# Patient Record
Sex: Male | Born: 1980 | Race: White | Hispanic: No | Marital: Married | Smoking: Never smoker
Health system: Southern US, Community
[De-identification: ages and names within clinical notes are randomized; demographics above are authoritative.]

## PROBLEM LIST (undated history)

## (undated) DIAGNOSIS — J45909 Unspecified asthma, uncomplicated: Secondary | ICD-10-CM

## (undated) DIAGNOSIS — I10 Essential (primary) hypertension: Secondary | ICD-10-CM

---

## 2008-06-28 ENCOUNTER — Emergency Department (HOSPITAL_BASED_OUTPATIENT_CLINIC_OR_DEPARTMENT_OTHER): Admission: EM | Admit: 2008-06-28 | Discharge: 2008-06-28 | Payer: Self-pay | Admitting: Emergency Medicine

## 2011-07-10 LAB — URINALYSIS, ROUTINE W REFLEX MICROSCOPIC
Bilirubin Urine: NEGATIVE
Leukocytes, UA: NEGATIVE
Nitrite: NEGATIVE
Specific Gravity, Urine: 1.013
pH: 6

## 2011-07-10 LAB — URINE MICROSCOPIC-ADD ON

## 2015-12-30 ENCOUNTER — Encounter (HOSPITAL_COMMUNITY): Payer: Self-pay | Admitting: Emergency Medicine

## 2015-12-30 ENCOUNTER — Emergency Department (INDEPENDENT_AMBULATORY_CARE_PROVIDER_SITE_OTHER)
Admission: EM | Admit: 2015-12-30 | Discharge: 2015-12-30 | Disposition: A | Discharge status: Home / Self Care | Payer: 59 | Source: Home / Self Care | Attending: Family Medicine | Admitting: Family Medicine

## 2015-12-30 DIAGNOSIS — J45901 Unspecified asthma with (acute) exacerbation: Secondary | ICD-10-CM | POA: Diagnosis not present

## 2015-12-30 MED ORDER — METHYLPREDNISOLONE ACETATE 80 MG/ML IJ SUSP
80.0000 mg | Freq: Once | INTRAMUSCULAR | Status: AC
  Administered 2015-12-30: 80 mg via INTRAMUSCULAR

## 2015-12-30 MED ORDER — ALBUTEROL SULFATE (2.5 MG/3ML) 0.083% IN NEBU
2.5000 mg | INHALATION_SOLUTION | Freq: Once | RESPIRATORY_TRACT | Status: AC
  Administered 2015-12-30: 2.5 mg via RESPIRATORY_TRACT

## 2015-12-30 MED ORDER — IPRATROPIUM-ALBUTEROL 0.5-2.5 (3) MG/3ML IN SOLN
RESPIRATORY_TRACT | Status: AC
  Filled 2015-12-30: qty 3

## 2015-12-30 MED ORDER — ALBUTEROL SULFATE (2.5 MG/3ML) 0.083% IN NEBU
INHALATION_SOLUTION | RESPIRATORY_TRACT | Status: AC
  Filled 2015-12-30: qty 3

## 2015-12-30 MED ORDER — METHYLPREDNISOLONE ACETATE 80 MG/ML IJ SUSP
INTRAMUSCULAR | Status: AC
  Filled 2015-12-30: qty 1

## 2015-12-30 MED ORDER — IPRATROPIUM-ALBUTEROL 0.5-2.5 (3) MG/3ML IN SOLN
3.0000 mL | Freq: Once | RESPIRATORY_TRACT | Status: AC
  Administered 2015-12-30: 3 mL via RESPIRATORY_TRACT

## 2015-12-30 MED ORDER — ALBUTEROL SULFATE HFA 108 (90 BASE) MCG/ACT IN AERS: 2.0000 | INHALATION_SPRAY | RESPIRATORY_TRACT | Status: AC | PRN

## 2015-12-30 MED ORDER — PREDNISONE 20 MG PO TABS: ORAL_TABLET | ORAL | Status: DC

## 2015-12-30 NOTE — ED Provider Notes (Signed)
CSN: [id number]     Arrival date & time 12/30/15  1539 History   First MD Initiated Contact with Patient 12/30/15 1816     Chief Complaint  Patient presents with  . Shortness of Breath   (Consider location/radiation/quality/duration/timing/severity/associated sxs/prior Treatment) HPI Comments: 35 year old obese male with a history of asthma presents with a cough for one month and dyspnea and wheezing for over a week. He states he has not had an asthma attack in years and does not have any inhalers or other medicines. He denies PND or other allergy symptoms. He is noticed to have sniffles and PND on exam.   History reviewed. No pertinent past medical history. History reviewed. No pertinent past surgical history. No family history on file. Social History  Substance Use Topics  . Smoking status: Never Smoker   . Smokeless tobacco: None  . Alcohol Use: No    Review of Systems  Constitutional: Negative.  Negative for fever.  HENT: Negative.   Respiratory: Positive for cough, shortness of breath and wheezing.   Gastrointestinal: Negative.   Musculoskeletal: Negative.   Skin: Negative.   Neurological: Negative.   All other systems reviewed and are negative.   Allergies  Review of patient's allergies indicates no known allergies.  Home Medications   Prior to Admission medications   Medication Sig Start Date End Date Taking? Authorizing Provider  albuterol (PROVENTIL HFA;VENTOLIN HFA) 108 (90 Base) MCG/ACT inhaler Inhale 2 puffs into the lungs every 4 (four) hours as needed for wheezing or shortness of breath. 12/30/15   Hayden Rasmussenavid Breelynn Bankert, NP  perindopril (ACEON) 2 MG tablet Take 2 mg by mouth daily.    Historical Provider, MD  predniSONE (DELTASONE) 20 MG tablet 3 Tabs PO Days 1-3, then 2 tabs PO Days 4-6, then 1 tab PO Day 7-9, then Half Tab PO Day 10-12 12/30/15   Hayden Rasmussenavid Nomi Rudnicki, NP   Meds Ordered and Administered this Visit   Medications  ipratropium-albuterol (DUONEB) 0.5-2.5 (3) MG/3ML  nebulizer solution 3 mL (3 mLs Nebulization Given 12/30/15 1948)  albuterol (PROVENTIL) (2.5 MG/3ML) 0.083% nebulizer solution 2.5 mg (2.5 mg Nebulization Given 12/30/15 1948)  methylPREDNISolone acetate (DEPO-MEDROL) injection 80 mg (80 mg Intramuscular Given 12/30/15 1948)    BP 140/94 mmHg  Pulse 73  Temp(Src) 98.6 F (37 C) (Oral)  Resp 16  SpO2 99% No data found.   Physical Exam  Constitutional: He is oriented to person, place, and time. He appears well-developed and well-nourished. No distress.  HENT:  Head: Normocephalic and atraumatic.  Oropharynx with minor erythema, cobblestoning and clear PND.  Eyes: Conjunctivae and EOM are normal.  Neck: Normal range of motion. Neck supple.  Cardiovascular: Normal rate, regular rhythm, normal heart sounds and intact distal pulses.   Pulmonary/Chest: Effort normal. No respiratory distress. He has wheezes.  Diminished expiratory breath sounds. A few wheezes with forced expiration. Cough with mild coarseness and distal wheezing.  Musculoskeletal: He exhibits no edema.  Lymphadenopathy:    He has no cervical adenopathy.  Neurological: He is alert and oriented to person, place, and time. He exhibits normal muscle tone.  Skin: Skin is warm and dry. He is not diaphoretic.  Psychiatric: He has a normal mood and affect.  Nursing note and vitals reviewed.   ED Course  Procedures (including critical care time)  Labs Review Labs Reviewed - No data to display  Imaging Review No results found.   Visual Acuity Review  Right Eye Distance:   Left Eye Distance:   Bilateral  Distance:    Right Eye Near:   Left Eye Near:    Bilateral Near:         MDM   1. Asthma exacerbation    Meds ordered this encounter  Medications  . perindopril (ACEON) 2 MG tablet    Sig: Take 2 mg by mouth daily.  Marland Kitchen ipratropium-albuterol (DUONEB) 0.5-2.5 (3) MG/3ML nebulizer solution 3 mL    Sig:   . albuterol (PROVENTIL) (2.5 MG/3ML) 0.083% nebulizer  solution 2.5 mg    Sig:   . methylPREDNISolone acetate (DEPO-MEDROL) injection 80 mg    Sig:   . albuterol (PROVENTIL HFA;VENTOLIN HFA) 108 (90 Base) MCG/ACT inhaler    Sig: Inhale 2 puffs into the lungs every 4 (four) hours as needed for wheezing or shortness of breath.    Dispense:  1 Inhaler    Refill:  0    Order Specific Question:  Supervising Provider    Answer:  Bradd Canary D [id number]  . predniSONE (DELTASONE) 20 MG tablet    Sig: 3 Tabs PO Days 1-3, then 2 tabs PO Days 4-6, then 1 tab PO Day 7-9, then Half Tab PO Day 10-12    Dispense:  20 tablet    Refill:  0    Order Specific Question:  Supervising Provider    Answer:  Linna Hoff [phone]   Post DuoNeb there was much improvement in air movement and decrease and wheezes. Patient is discharged with prescription for albuterol HFA and prednisone. Also asked to change from Claritin to Allegra 180 mg a day follow-up PCP as needed. For worsening or new symptoms may return or go to urgent department.    Hayden Rasmussen, NP 12/30/15 2023

## 2015-12-30 NOTE — ED Notes (Signed)
Pt c/o SOB, wheezing and dry cough onset x1 month that is getting worse Denies fevers, chills.... Hx of asthma A&O x4... No acute distress.

## 2015-12-30 NOTE — Discharge Instructions (Signed)
Asthma, Acute Bronchospasm Acute bronchospasm caused by asthma is also referred to as an asthma attack. Bronchospasm means your air passages become narrowed. The narrowing is caused by inflammation and tightening of the muscles in the air tubes (bronchi) in your lungs. This can make it hard to breathe or cause you to wheeze and cough. CAUSES Possible triggers are: 1. Animal dander from the skin, hair, or feathers of animals. 2. Dust mites contained in house dust. 3. Cockroaches. 4. Pollen from trees or grass. 5. Mold. 6. Cigarette or tobacco smoke. 7. Air pollutants such as dust, household cleaners, hair sprays, aerosol sprays, paint fumes, strong chemicals, or strong odors. 8. Cold air or weather changes. Cold air may trigger inflammation. Winds increase molds and pollens in the air. 9. Strong emotions such as crying or laughing hard. 10. Stress. 11. Certain medicines such as aspirin or beta-blockers. 12. Sulfites in foods and drinks, such as dried fruits and wine. 13. Infections or inflammatory conditions, such as a flu, cold, or inflammation of the nasal membranes (rhinitis). 14. Gastroesophageal reflux disease (GERD). GERD is a condition where stomach acid backs up into your esophagus. 15. Exercise or strenuous activity. SIGNS AND SYMPTOMS   Wheezing.  Excessive coughing, particularly at night.  Chest tightness.  Shortness of breath. DIAGNOSIS  Your health care provider will ask you about your medical history and perform a physical exam. A chest X-ray or blood testing may be performed to look for other causes of your symptoms or other conditions that may have triggered your asthma attack. TREATMENT  Treatment is aimed at reducing inflammation and opening up the airways in your lungs. Most asthma attacks are treated with inhaled medicines. These include quick relief or rescue medicines (such as bronchodilators) and controller medicines (such as inhaled corticosteroids). These  medicines are sometimes given through an inhaler or a nebulizer. Systemic steroid medicine taken by mouth or given through an IV tube also can be used to reduce the inflammation when an attack is moderate or severe. Antibiotic medicines are only used if a bacterial infection is present.  HOME CARE INSTRUCTIONS   Rest.  Drink plenty of liquids. This helps the mucus to remain thin and be easily coughed up. Only use caffeine in moderation and do not use alcohol until you have recovered from your illness.  Do not smoke. Avoid being exposed to secondhand smoke.  You play a critical role in keeping yourself in good health. Avoid exposure to things that cause you to wheeze or to have breathing problems.  Keep your medicines up-to-date and available. Carefully follow your health care provider's treatment plan.  Take your medicine exactly as prescribed.  When pollen or pollution is bad, keep windows closed and use an air conditioner or go to places with air conditioning.  Asthma requires careful medical care. See your health care provider for a follow-up as advised. If you are more than [redacted] weeks pregnant and you were prescribed any new medicines, let your obstetrician know about the visit and how you are doing. Follow up with your health care provider as directed.  After you have recovered from your asthma attack, make an appointment with your outpatient doctor to talk about ways to reduce the likelihood of future attacks. If you do not have a doctor who manages your asthma, make an appointment with a primary care doctor to discuss your asthma. SEEK IMMEDIATE MEDICAL CARE IF:   You are getting worse.  You have trouble breathing. If severe, call your local  emergency services (911 in the [place]  You develop chest pain or discomfort.  You are vomiting.  You are not able to keep fluids down.  You are coughing up yellow, green, brown, or bloody sputum.  You have a fever and your symptoms suddenly  get worse.  You have trouble swallowing. MAKE SURE YOU:   Understand these instructions.  Will watch your condition.  Will get help right away if you are not doing well or get worse.   This information is not intended to replace advice given to you by your health care provider. Make sure you discuss any questions you have with your health care provider.   Document Released: 01/10/2007 Document Revised: 09/30/2013 Document Reviewed: 04/02/2013 Elsevier Interactive Patient Education 2016 [employer].  How to Use an Inhaler Using your inhaler correctly is very important. Good technique will make sure that the medicine reaches your lungs.  HOW TO USE AN INHALER: 16. Take the cap off the inhaler. 17. If this is the first time using your inhaler, you need to prime it. Shake the inhaler for 5 seconds. Release four puffs into the air, away from your face. Ask your doctor for help if you have questions. 18. Shake the inhaler for 5 seconds. 19. Turn the inhaler so the bottle is above the mouthpiece. 20. Put your pointer finger on top of the bottle. Your thumb holds the bottom of the inhaler. 21. Open your mouth. 22. Either hold the inhaler away from your mouth (the width of 2 fingers) or place your lips tightly around the mouthpiece. Ask your doctor which way to use your inhaler. 23. Breathe out as much air as possible. 24. Breathe in and push down on the bottle 1 time to release the medicine. You will feel the medicine go in your mouth and throat. 25. Continue to take a deep breath in very slowly. Try to fill your lungs. 26. After you have breathed in completely, hold your breath for 10 seconds. This will help the medicine to settle in your lungs. If you cannot hold your breath for 10 seconds, hold it for as long as you can before you breathe out. 27. Breathe out slowly, through pursed lips. Whistling is an example of pursed lips. 28. If your doctor has told you to take more than 1 puff, wait  at least 15-30 seconds between puffs. This will help you get the best results from your medicine. Do not use the inhaler more than your doctor tells you to. 29. Put the cap back on the inhaler. 30. Follow the directions from your doctor or from the inhaler package about cleaning the inhaler. If you use more than one inhaler, ask your doctor which inhalers to use and what order to use them in. Ask your doctor to help you figure out when you will need to refill your inhaler.  If you use a steroid inhaler, always rinse your mouth with water after your last puff, gargle and spit out the water. Do not swallow the water. GET HELP IF:  The inhaler medicine only partially helps to stop wheezing or shortness of breath.  You are having trouble using your inhaler.  You have some increase in thick spit (phlegm). GET HELP RIGHT AWAY IF:  The inhaler medicine does not help your wheezing or shortness of breath or you have tightness in your chest.  You have dizziness, headaches, or fast heart rate.  You have chills, fever, or night sweats.  You have a large  increase of thick spit, or your thick spit is bloody. MAKE SURE YOU:   Understand these instructions.  Will watch your condition.  Will get help right away if you are not doing well or get worse.   This information is not intended to replace advice given to you by your health care provider. Make sure you discuss any questions you have with your health care provider.   Document Released: 07/04/2008 Document Revised: 07/16/2013 Document Reviewed: 04/24/2013 Elsevier Interactive Patient Education [employer]2016 Elsevier Inc.

## 2015-12-31 MED FILL — VENTOLIN HFA 90 MCG INHALER: 108 (90 BAS | 17 days supply | Qty: 18 | Fill #0

## 2015-12-31 MED FILL — predniSONE 20 MG TABS: 20 | 12 days supply | Qty: 20 | Fill #0

## 2016-01-02 ENCOUNTER — Emergency Department (HOSPITAL_BASED_OUTPATIENT_CLINIC_OR_DEPARTMENT_OTHER): Payer: 59

## 2016-01-02 ENCOUNTER — Emergency Department (HOSPITAL_BASED_OUTPATIENT_CLINIC_OR_DEPARTMENT_OTHER)
Admission: EM | Admit: 2016-01-02 | Discharge: 2016-01-03 | Disposition: A | Discharge status: Home / Self Care | Payer: 59 | Attending: Emergency Medicine | Admitting: Emergency Medicine

## 2016-01-02 ENCOUNTER — Encounter (HOSPITAL_BASED_OUTPATIENT_CLINIC_OR_DEPARTMENT_OTHER): Payer: Self-pay | Admitting: Emergency Medicine

## 2016-01-02 DIAGNOSIS — J45901 Unspecified asthma with (acute) exacerbation: Secondary | ICD-10-CM | POA: Insufficient documentation

## 2016-01-02 DIAGNOSIS — Z79899 Other long term (current) drug therapy: Secondary | ICD-10-CM | POA: Insufficient documentation

## 2016-01-02 DIAGNOSIS — R0602 Shortness of breath: Secondary | ICD-10-CM | POA: Diagnosis not present

## 2016-01-02 HISTORY — DX: Unspecified asthma, uncomplicated: J45.909

## 2016-01-02 NOTE — ED Notes (Signed)
Patient was at Urgent care 3 days ago for the same SOb, reports that today he felt like his SOB was getting worse.

## 2016-01-03 DIAGNOSIS — J45901 Unspecified asthma with (acute) exacerbation: Secondary | ICD-10-CM | POA: Diagnosis not present

## 2016-01-03 DIAGNOSIS — Z79899 Other long term (current) drug therapy: Secondary | ICD-10-CM | POA: Diagnosis not present

## 2016-01-03 MED ORDER — ALBUTEROL SULFATE (2.5 MG/3ML) 0.083% IN NEBU
2.5000 mg | INHALATION_SOLUTION | Freq: Once | RESPIRATORY_TRACT | Status: AC
  Administered 2016-01-03: 2.5 mg via RESPIRATORY_TRACT
  Filled 2016-01-03: qty 3

## 2016-01-03 MED ORDER — IPRATROPIUM-ALBUTEROL 0.5-2.5 (3) MG/3ML IN SOLN
3.0000 mL | Freq: Four times a day (QID) | RESPIRATORY_TRACT | Status: DC
  Administered 2016-01-03: 3 mL via RESPIRATORY_TRACT
  Filled 2016-01-03: qty 3

## 2016-01-03 NOTE — ED Provider Notes (Signed)
CSN: [id number]     Arrival date & time 01/02/16  2251 History   First MD Initiated Contact with Patient 01/03/16 [phone]     Chief Complaint  Patient presents with  . Shortness of Breath     (Consider location/radiation/quality/duration/timing/severity/associated sxs/prior Treatment) HPI  This is a 35 year old male with a history of asthma. He has had a one-month history of cough and a one-week history of shortness of breath. He was seen at an urgent care 4 days ago and was given an inhaler and steroids. Despite being compliant with treatment he has not gotten adequate relief and he has been using his inhaler more frequently than every 4 hours. He denies fever. He has had wheezing. He is having some upper abdominal wall pain which she attributes to coughing.  He was assessed by respiratory therapy prior to my evaluation who found him to be wheezing. He was given 2 neb treatments with improvement. He was also given an AeroChamber and instructed in its use.  Past Medical History  Diagnosis Date  . Asthma    History reviewed. No pertinent past surgical history. History reviewed. No pertinent family history. Social History  Substance Use Topics  . Smoking status: Never Smoker   . Smokeless tobacco: None  . Alcohol Use: No    Review of Systems  All other systems reviewed and are negative.   Allergies  Review of patient's allergies indicates no known allergies.  Home Medications   Prior to Admission medications   Medication Sig Start Date End Date Taking? Authorizing Provider  albuterol (PROVENTIL HFA;VENTOLIN HFA) 108 (90 Base) MCG/ACT inhaler Inhale 2 puffs into the lungs every 4 (four) hours as needed for wheezing or shortness of breath. 12/30/15   Hayden Rasmussen, NP  perindopril (ACEON) 2 MG tablet Take 2 mg by mouth daily.    Historical Provider, MD  predniSONE (DELTASONE) 20 MG tablet 3 Tabs PO Days 1-3, then 2 tabs PO Days 4-6, then 1 tab PO Day 7-9, then Half Tab PO Day 10-12  12/30/15   Hayden Rasmussen, NP   BP 127/63 mmHg  Pulse 73  Temp(Src) 98.6 F (37 C) (Oral)  Resp 14  Ht  (1.727 m)  Wt 360 lb (163.295 kg)  BMI 54.75 kg/m2  SpO2 100%   Physical Exam  General: Well-developed, well-nourished male in no acute distress; appearance consistent with age of record HENT: normocephalic; atraumatic; pharynx normal Eyes: pupils equal, round and reactive to light; extraocular muscles intact Neck: supple Heart: regular rate and rhythm Lungs: Faint coarse sounds on inspiration, no expiratory wheezes Abdomen: soft; obese; nontender; bowel sounds present Extremities: No deformity; full range of motion; pulses normal Neurologic: Awake, alert and oriented; motor function intact in all extremities and symmetric; no facial droop Skin: Warm and dry Psychiatric: Normal mood and affect    ED Course  Procedures (including critical care time)   EKG Interpretation   Date/Time:  Sunday January 02 2016 23:07:59 EDT Ventricular Rate:  87 PR Interval:  130 QRS Duration: 105 QT Interval:  385 QTC Calculation: 463 R Axis:   87 Text Interpretation:  Sinus rhythm Probable inferior infarct, old No  previous ECGs available Confirmed by Barbaraann Avans  MD, Jonny Ruiz ([id number]) on 01/03/2016  1:57:48 AM      MDM  Nursing notes and vitals signs, including pulse oximetry, reviewed.  Summary of this visit's results, reviewed by myself:  Imaging Studies: Dg Chest 2 View  01/03/2016  CLINICAL DATA:  Shortness of breath for  3 days.  History of asthma. EXAM: CHEST  2 VIEW COMPARISON:  None. FINDINGS: Cardiomediastinal silhouette is unremarkable for this low inspiratory examination with crowded vasculature markings. The lungs are clear without pleural effusions or focal consolidations. Mild bronchitic changes. Trachea projects midline and there is no pneumothorax. Included soft tissue planes and osseous structures are non-suspicious. Large body habitus. IMPRESSION: Mild bronchitic changes in  this low inspiratory examination. Electronically Signed   By: Awilda Metroourtnay  Bloomer M.D.   On: 01/03/2016 00:29       Paula LibraJohn Lenzie Montesano, MD 01/03/16 [phone]

## 2016-01-03 NOTE — Discharge Instructions (Signed)
Asthma Attack Prevention While you may not be able to control the fact that you have asthma, you can take actions to prevent asthma attacks. The best way to prevent asthma attacks is to maintain good control of your asthma. You can achieve this by:  Taking your medicines as directed.  Avoiding things that can irritate your airways or make your asthma symptoms worse (asthma triggers).  Keeping track of how well your asthma is controlled and of any changes in your symptoms.  Responding quickly to worsening asthma symptoms (asthma attack).  Seeking emergency care when it is needed. WHAT ARE SOME WAYS TO PREVENT AN ASTHMA ATTACK? Have a Plan Work with your health care provider to create a written plan for managing and treating your asthma attacks (asthma action plan). This plan includes:  A list of your asthma triggers and how you can avoid them.  Information on when medicines should be taken and when their dosages should be changed.  The use of a device that measures how well your lungs are working (peak flow meter). Monitor Your Asthma Use your peak flow meter and record your results in a journal every day. A drop in your peak flow numbers on one or more days may indicate the start of an asthma attack. This can happen even before you start to feel symptoms. You can prevent an asthma attack from getting worse by following the steps in your asthma action plan. Avoid Asthma Triggers Work with your asthma health care provider to find out what your asthma triggers are. This can be done by:  Allergy testing.  Keeping a journal that notes when asthma attacks occur and the factors that may have contributed to them.  Determining if there are other medical conditions that are making your asthma worse. Once you have determined your asthma triggers, take steps to avoid them. This may include avoiding excessive or prolonged exposure to:  Dust. Have someone dust and vacuum your home for you once or  twice a week. Using a high-efficiency particulate arrestance (HEPA) vacuum is best.  Smoke. This includes campfire smoke, forest fire smoke, and secondhand smoke from tobacco products.  Pet dander. Avoid contact with animals that you know you are allergic to.  Allergens from trees, grasses or pollens. Avoid spending a lot of time outdoors when pollen counts are high, and on very windy days.  Very cold, dry, or humid air.  Mold.  Foods that contain high amounts of sulfites.  Strong odors.  Outdoor air pollutants, such as engine exhaust.  Indoor air pollutants, such as aerosol sprays and fumes from household cleaners.  Household pests, including dust mites and cockroaches, and pest droppings.  Certain medicines, including NSAIDs. Always talk to your health care provider before stopping or starting any new medicines. Medicines Take over-the-counter and prescription medicines only as told by your health care provider. Many asthma attacks can be prevented by carefully following your medicine schedule. Taking your medicines correctly is especially important when you cannot avoid certain asthma triggers. Act Quickly If an asthma attack does happen, acting quickly can decrease how severe it is and how long it lasts. Take these steps:   Pay attention to your symptoms. If you are coughing, wheezing, or having difficulty breathing, do not wait to see if your symptoms go away on their own. Follow your asthma action plan.  If you have followed your asthma action plan and your symptoms are not improving, call your health care provider or seek immediate medical care   at the nearest hospital. It is important to note how often you need to use your fast-acting rescue inhaler. If you are using your rescue inhaler more often, it may mean that your asthma is not under control. Adjusting your asthma treatment plan may help you to prevent future asthma attacks and help you to gain better control of your  condition. HOW CAN I PREVENT AN ASTHMA ATTACK WHEN I EXERCISE? Follow advice from your health care provider about whether you should use your fast-acting inhaler before exercising. Many people with asthma experience exercise-induced bronchoconstriction (EIB). This condition often worsens during vigorous exercise in cold, humid, or dry environments. Usually, people with EIB can stay very active by pre-treating with a fast-acting inhaler before exercising.   This information is not intended to replace advice given to you by your health care provider. Make sure you discuss any questions you have with your health care provider.   Document Released: 09/13/2009 Document Revised: 06/16/2015 Document Reviewed: 02/25/2015 Elsevier Interactive Patient Education 2016 Elsevier Inc.  

## 2016-01-05 DIAGNOSIS — J301 Allergic rhinitis due to pollen: Secondary | ICD-10-CM | POA: Diagnosis not present

## 2016-01-05 DIAGNOSIS — J9801 Acute bronchospasm: Secondary | ICD-10-CM | POA: Diagnosis not present

## 2016-01-05 MED FILL — AZITHROMYCIN 250 MG TABLET: 250 | 5 days supply | Qty: 6 | Fill #0

## 2016-01-05 MED FILL — MONTELUKAST SOD 10 MG TAB: 10 | 30 days supply | Qty: 30 | Fill #0

## 2016-01-05 MED FILL — ADVAIR 250/50 DISKUS: 250-50 | 30 days supply | Qty: 60 | Fill #0

## 2016-01-19 DIAGNOSIS — I10 Essential (primary) hypertension: Secondary | ICD-10-CM | POA: Diagnosis not present

## 2016-01-19 DIAGNOSIS — J4520 Mild intermittent asthma, uncomplicated: Secondary | ICD-10-CM | POA: Diagnosis not present

## 2016-02-18 MED FILL — ADVAIR 250/50 DISKUS: 250-50 | 30 days supply | Qty: 60 | Fill #1

## 2016-02-18 MED FILL — METOPROLOL SUCC ER 25 MG TA: 25 | 30 days supply | Qty: 30 | Fill #0

## 2016-02-18 MED FILL — PERINDOPRIL ERBUMINE 8 MG T: 8 | 30 days supply | Qty: 30 | Fill #0

## 2016-02-18 MED FILL — MONTELUKAST SOD 10 MG TAB: 10 | 30 days supply | Qty: 30 | Fill #1

## 2016-03-03 DIAGNOSIS — R739 Hyperglycemia, unspecified: Secondary | ICD-10-CM | POA: Diagnosis not present

## 2016-03-03 DIAGNOSIS — I10 Essential (primary) hypertension: Secondary | ICD-10-CM | POA: Diagnosis not present

## 2016-03-03 DIAGNOSIS — M25522 Pain in left elbow: Secondary | ICD-10-CM | POA: Diagnosis not present

## 2016-03-16 MED FILL — DICLOFENAC SODIUM 1% GEL: 1 | 13 days supply | Qty: 100 | Fill #0

## 2016-03-23 MED FILL — METOPROLOL SUCC ER 25 MG TA: 25 | 30 days supply | Qty: 30 | Fill #1

## 2016-04-07 MED FILL — PERINDOPRIL ERBUMINE 8 MG T: 8 | 30 days supply | Qty: 30 | Fill #0

## 2016-04-20 MED FILL — METOPROLOL SUCC ER 25 MG TA: 25 | 90 days supply | Qty: 90 | Fill #0

## 2016-05-05 MED FILL — PERINDOPRIL ERBUMINE 8 MG T: 8 | 30 days supply | Qty: 30 | Fill #1

## 2016-06-08 MED FILL — PERINDOPRIL ERBUMINE 8 MG T: 8 | 30 days supply | Qty: 30 | Fill #2

## 2016-07-13 MED FILL — PERINDOPRIL ERBUMINE 8 MG T: 8 | 30 days supply | Qty: 30 | Fill #3

## 2016-07-25 DIAGNOSIS — I10 Essential (primary) hypertension: Secondary | ICD-10-CM | POA: Diagnosis not present

## 2016-07-25 DIAGNOSIS — L209 Atopic dermatitis, unspecified: Secondary | ICD-10-CM | POA: Diagnosis not present

## 2016-07-25 MED FILL — METOPROLOL TARTRATE 50 MG T: 50 | 30 days supply | Qty: 60 | Fill #0

## 2016-08-11 MED FILL — PERINDOPRIL ERBUMINE 8 MG T: 8 | 30 days supply | Qty: 30 | Fill #4

## 2016-08-17 DIAGNOSIS — I10 Essential (primary) hypertension: Secondary | ICD-10-CM | POA: Diagnosis not present

## 2016-08-29 MED FILL — METOPROLOL TARTRATE 50 MG T: 50 | 30 days supply | Qty: 60 | Fill #1

## 2016-09-01 MED FILL — PERINDOPRIL ERBUMINE 8 MG T: 8 | 30 days supply | Qty: 30 | Fill #5

## 2016-09-29 MED FILL — METOPROLOL TARTRATE 50 MG T: 50 | 30 days supply | Qty: 60 | Fill #2

## 2016-10-12 MED FILL — PERINDOPRIL ERBUMINE 8 MG T: 8 | 30 days supply | Qty: 30 | Fill #6

## 2016-10-30 DIAGNOSIS — I10 Essential (primary) hypertension: Secondary | ICD-10-CM | POA: Diagnosis not present

## 2016-10-30 MED FILL — METOPROLOL TARTRATE 50 MG T: 50 | 30 days supply | Qty: 90 | Fill #0

## 2016-11-09 MED FILL — PERINDOPRIL ERBUMINE 8 MG T: 8 | 30 days supply | Qty: 30 | Fill #7

## 2016-12-13 IMAGING — DX DG CHEST 2V
2 series · 2 of 2 positions shown · non-contrast
Comparison: None.

CLINICAL DATA: Shortness of breath for 3 days.  History of asthma.

EXAM:
CHEST  2 VIEW

[chest pa]
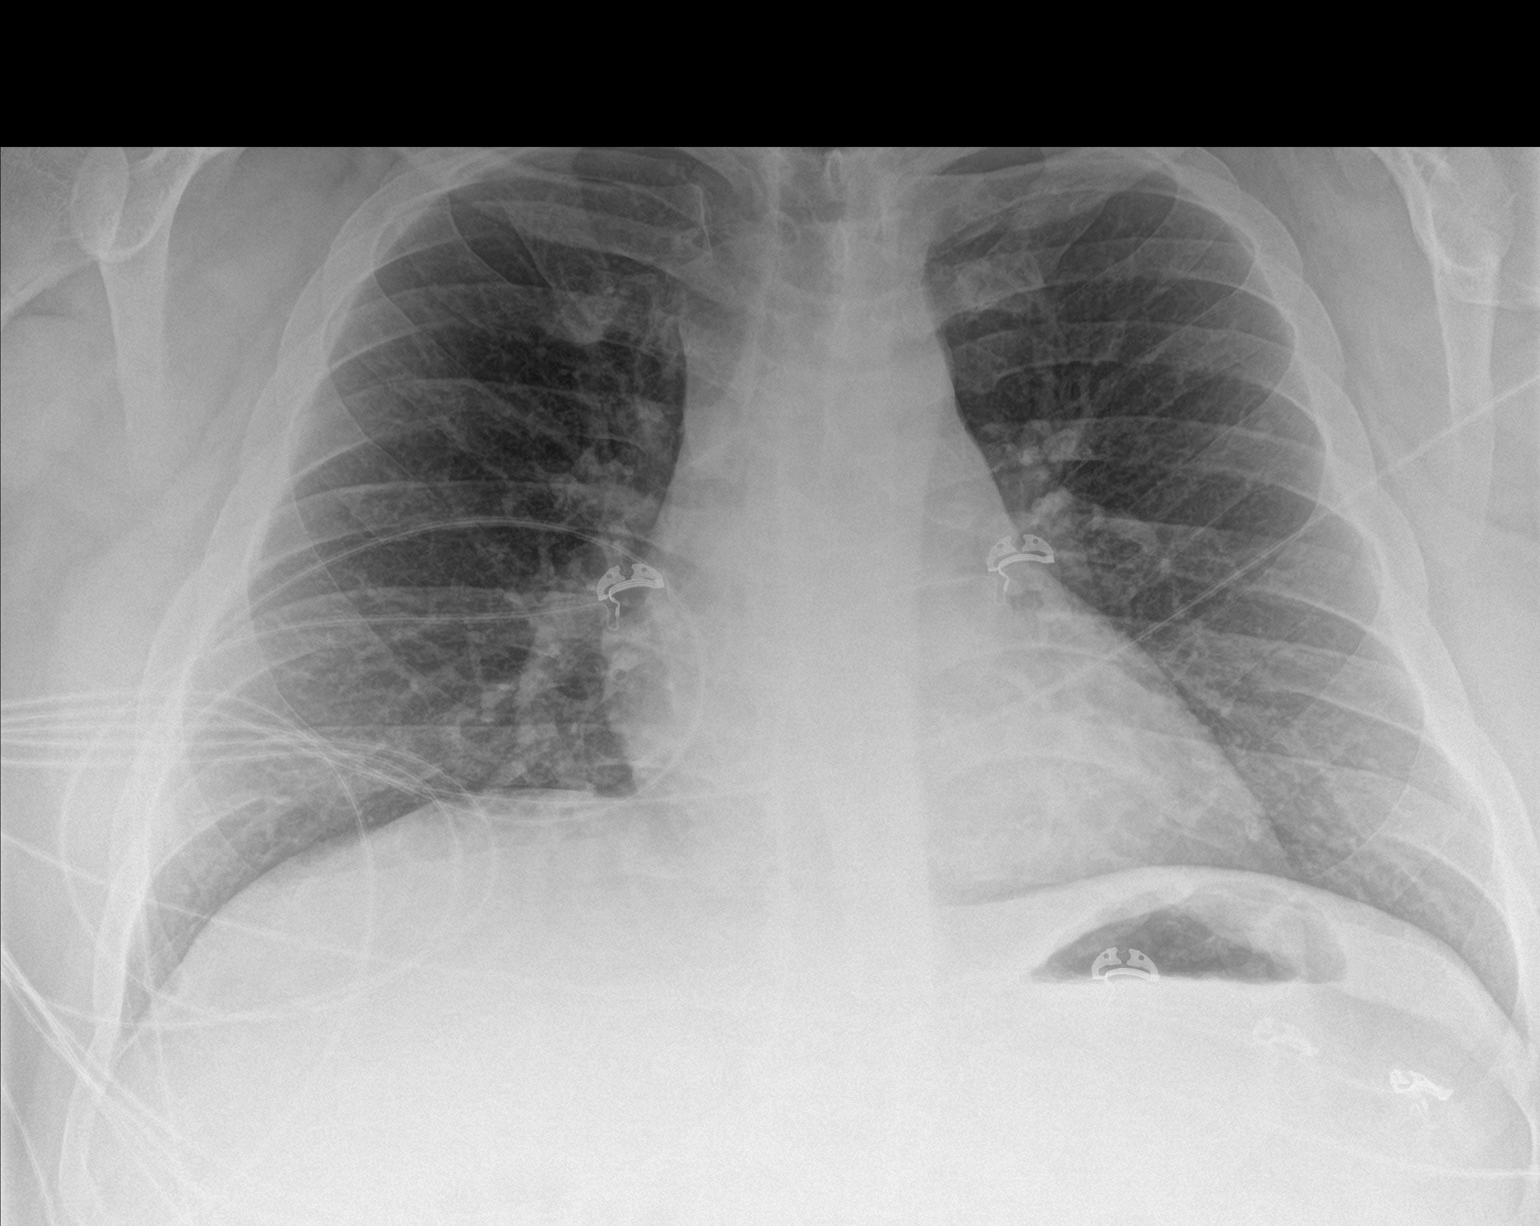

[chest lat]
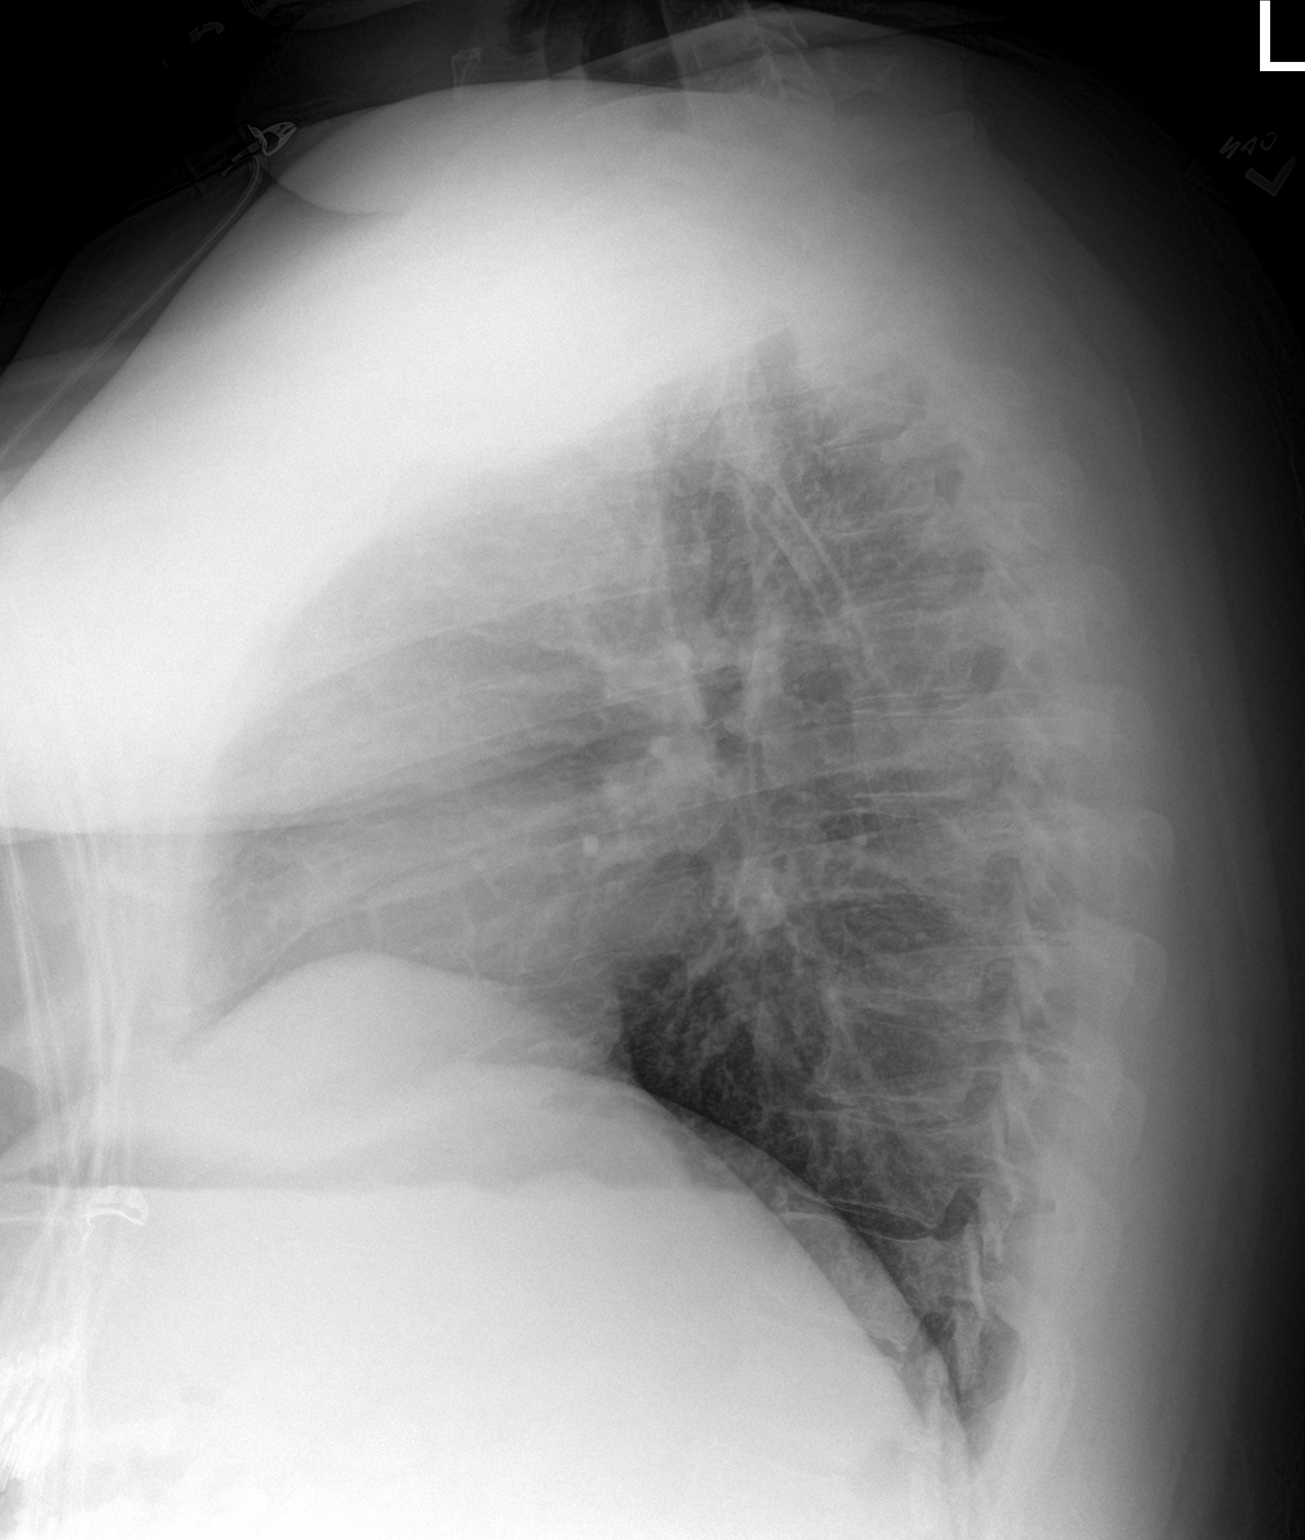

[2 of 2 positions shown; findings below may reference images not displayed]

FINDINGS: Cardiomediastinal silhouette is unremarkable for this low
inspiratory examination with crowded vasculature markings. The lungs
are clear without pleural effusions or focal consolidations. Mild
bronchitic changes. Trachea projects midline and there is no
pneumothorax. Included soft tissue planes and osseous structures are
non-suspicious. Large body habitus.
IMPRESSION: Mild bronchitic changes in this low inspiratory examination.

## 2016-12-14 MED FILL — PERINDOPRIL ERBUMINE 8 MG T: 8 | 30 days supply | Qty: 30 | Fill #8

## 2016-12-14 MED FILL — METOPROLOL TARTRATE 50 MG T: 50 | 30 days supply | Qty: 90 | Fill #1

## 2017-01-17 MED FILL — PERINDOPRIL ERBUMINE 8 MG T: 8 | 30 days supply | Qty: 30 | Fill #9

## 2017-02-02 MED FILL — METOPROLOL TARTRATE 50 MG T: 50 | 30 days supply | Qty: 90 | Fill #2

## 2017-03-09 MED FILL — PERINDOPRIL ERBUMINE 8 MG T: 8 | 30 days supply | Qty: 30 | Fill #10

## 2017-03-19 MED FILL — METOPROLOL TARTRATE 50 MG T: 50 | 30 days supply | Qty: 90 | Fill #3

## 2017-04-09 MED FILL — PERINDOPRIL ERBUMINE 8 MG T: 8 | 30 days supply | Qty: 30 | Fill #0

## 2017-05-09 MED FILL — PERINDOPRIL ERBUMINE 8 MG T: 8 | 30 days supply | Qty: 30 | Fill #1

## 2017-05-09 MED FILL — METOPROLOL TARTRATE 50 MG T: 50 | 30 days supply | Qty: 90 | Fill #4

## 2017-06-01 DIAGNOSIS — L03311 Cellulitis of abdominal wall: Secondary | ICD-10-CM | POA: Diagnosis not present

## 2017-06-01 DIAGNOSIS — L309 Dermatitis, unspecified: Secondary | ICD-10-CM | POA: Diagnosis not present

## 2017-06-01 MED FILL — CEPHALEXIN 500 MG CAPSULE: 500 | 7 days supply | Qty: 21 | Fill #0

## 2017-06-02 ENCOUNTER — Encounter (HOSPITAL_BASED_OUTPATIENT_CLINIC_OR_DEPARTMENT_OTHER): Payer: Self-pay | Admitting: Emergency Medicine

## 2017-06-02 DIAGNOSIS — Z79899 Other long term (current) drug therapy: Secondary | ICD-10-CM | POA: Diagnosis not present

## 2017-06-02 DIAGNOSIS — J45909 Unspecified asthma, uncomplicated: Secondary | ICD-10-CM | POA: Diagnosis present

## 2017-06-02 DIAGNOSIS — Z7952 Long term (current) use of systemic steroids: Secondary | ICD-10-CM | POA: Diagnosis not present

## 2017-06-02 DIAGNOSIS — Z6841 Body Mass Index (BMI) 40.0 and over, adult: Secondary | ICD-10-CM | POA: Diagnosis not present

## 2017-06-02 DIAGNOSIS — I10 Essential (primary) hypertension: Secondary | ICD-10-CM | POA: Diagnosis present

## 2017-06-02 DIAGNOSIS — L03311 Cellulitis of abdominal wall: Principal | ICD-10-CM | POA: Diagnosis present

## 2017-06-02 DIAGNOSIS — E876 Hypokalemia: Secondary | ICD-10-CM | POA: Diagnosis present

## 2017-06-02 NOTE — ED Triage Notes (Signed)
Patient states that he was bit by some bee's about 2 weeks ago. He started to have other welts show up. Yesterday he went to his PMD because of one on his leftlower abdominal area. Was given a shot of rocephin and started on Keflex. Patient reports that the wound is not any better

## 2017-06-03 ENCOUNTER — Inpatient Hospital Stay (HOSPITAL_BASED_OUTPATIENT_CLINIC_OR_DEPARTMENT_OTHER)
Admission: EM | Admit: 2017-06-03 | Discharge: 2017-06-06 | DRG: 603 | Disposition: A | Discharge status: Home / Self Care | Payer: 59 | Attending: Internal Medicine | Admitting: Internal Medicine

## 2017-06-03 DIAGNOSIS — L03311 Cellulitis of abdominal wall: Secondary | ICD-10-CM | POA: Diagnosis not present

## 2017-06-03 DIAGNOSIS — L0390 Cellulitis, unspecified: Secondary | ICD-10-CM | POA: Diagnosis not present

## 2017-06-03 DIAGNOSIS — E876 Hypokalemia: Secondary | ICD-10-CM | POA: Diagnosis present

## 2017-06-03 DIAGNOSIS — J45909 Unspecified asthma, uncomplicated: Secondary | ICD-10-CM | POA: Diagnosis present

## 2017-06-03 DIAGNOSIS — E875 Hyperkalemia: Secondary | ICD-10-CM | POA: Diagnosis not present

## 2017-06-03 DIAGNOSIS — Z6841 Body Mass Index (BMI) 40.0 and over, adult: Secondary | ICD-10-CM | POA: Diagnosis not present

## 2017-06-03 DIAGNOSIS — Z7952 Long term (current) use of systemic steroids: Secondary | ICD-10-CM | POA: Diagnosis not present

## 2017-06-03 DIAGNOSIS — I10 Essential (primary) hypertension: Secondary | ICD-10-CM | POA: Diagnosis not present

## 2017-06-03 DIAGNOSIS — R109 Unspecified abdominal pain: Secondary | ICD-10-CM | POA: Diagnosis not present

## 2017-06-03 DIAGNOSIS — Z79899 Other long term (current) drug therapy: Secondary | ICD-10-CM | POA: Diagnosis not present

## 2017-06-03 HISTORY — DX: Essential (primary) hypertension: I10

## 2017-06-03 LAB — CBC WITH DIFFERENTIAL/PLATELET
BASOS ABS: 0 10*3/uL (ref 0.0–0.1)
BASOS PCT: 0 %
EOS ABS: 0.4 10*3/uL (ref 0.0–0.7)
EOS PCT: 3 %
HCT: 43.7 % (ref 39.0–52.0)
Hemoglobin: 14.8 g/dL (ref 13.0–17.0)
Lymphocytes Relative: 29 %
Lymphs Abs: 3.3 10*3/uL (ref 0.7–4.0)
MCH: 28.8 pg (ref 26.0–34.0)
MCHC: 33.9 g/dL (ref 30.0–36.0)
MCV: 85.2 fL (ref 78.0–100.0)
Monocytes Absolute: 1.2 10*3/uL — ABNORMAL HIGH (ref 0.1–1.0)
Monocytes Relative: 10 %
Neutro Abs: 6.8 10*3/uL (ref 1.7–7.7)
Neutrophils Relative %: 58 %
PLATELETS: 222 10*3/uL (ref 150–400)
RBC: 5.13 MIL/uL (ref 4.22–5.81)
RDW: 13.1 % (ref 11.5–15.5)
WBC: 11.6 10*3/uL — AB (ref 4.0–10.5)

## 2017-06-03 LAB — COMPREHENSIVE METABOLIC PANEL
ALBUMIN: 3.9 g/dL (ref 3.5–5.0)
ALT: 24 U/L (ref 17–63)
AST: 20 U/L (ref 15–41)
Alkaline Phosphatase: 70 U/L (ref 38–126)
Anion gap: 7 (ref 5–15)
BUN: 18 mg/dL (ref 6–20)
CHLORIDE: 100 mmol/L — AB (ref 101–111)
CO2: 28 mmol/L (ref 22–32)
CREATININE: 0.83 mg/dL (ref 0.61–1.24)
Calcium: 8.6 mg/dL — ABNORMAL LOW (ref 8.9–10.3)
GFR calc non Af Amer: >60 mL/min (ref >60)
GLUCOSE: 91 mg/dL (ref 65–99)
Potassium: 3.2 mmol/L — ABNORMAL LOW (ref 3.5–5.1)
SODIUM: 135 mmol/L (ref 135–145)
Total Bilirubin: 0.7 mg/dL (ref 0.3–1.2)
Total Protein: 8.2 g/dL — ABNORMAL HIGH (ref 6.5–8.1)

## 2017-06-03 LAB — I-STAT CG4 LACTIC ACID, ED: Lactic Acid, Venous: 0.62 mmol/L (ref 0.5–1.9)

## 2017-06-03 MED ORDER — ALBUTEROL SULFATE (2.5 MG/3ML) 0.083% IN NEBU: 2.5000 mg | INHALATION_SOLUTION | RESPIRATORY_TRACT | Status: DC | PRN

## 2017-06-03 MED ORDER — CLINDAMYCIN PHOSPHATE 600 MG/50ML IV SOLN
600.0000 mg | Freq: Three times a day (TID) | INTRAVENOUS | Status: DC
  Administered 2017-06-03 – 2017-06-06 (×9): 600 mg via INTRAVENOUS
  Filled 2017-06-03 (×13): qty 50

## 2017-06-03 MED ORDER — RISAQUAD PO CAPS
2.0000 | ORAL_CAPSULE | Freq: Every day | ORAL | Status: DC
  Administered 2017-06-03 – 2017-06-06 (×4): 2 via ORAL
  Filled 2017-06-03 (×4): qty 2

## 2017-06-03 MED ORDER — CLINDAMYCIN PHOSPHATE 600 MG/50ML IV SOLN
600.0000 mg | Freq: Once | INTRAVENOUS | Status: AC
  Administered 2017-06-03: 600 mg via INTRAVENOUS
  Filled 2017-06-03: qty 50

## 2017-06-03 MED ORDER — SODIUM CHLORIDE 0.9 % IV BOLUS (SEPSIS)
1000.0000 mL | Freq: Once | INTRAVENOUS | Status: AC
  Administered 2017-06-03: 1000 mL via INTRAVENOUS

## 2017-06-03 MED ORDER — METOPROLOL TARTRATE 50 MG PO TABS
100.0000 mg | ORAL_TABLET | Freq: Every day | ORAL | Status: DC
  Administered 2017-06-03 – 2017-06-06 (×4): 100 mg via ORAL
  Filled 2017-06-03 (×4): qty 2

## 2017-06-03 MED ORDER — POTASSIUM CHLORIDE 20 MEQ/15ML (10%) PO SOLN
40.0000 meq | Freq: Once | ORAL | Status: AC
  Administered 2017-06-03: 40 meq via ORAL
  Filled 2017-06-03: qty 30

## 2017-06-03 MED ORDER — TRANDOLAPRIL 1 MG PO TABS
1.0000 mg | ORAL_TABLET | Freq: Every day | ORAL | Status: DC
  Administered 2017-06-04 – 2017-06-06 (×3): 1 mg via ORAL
  Filled 2017-06-03 (×4): qty 1

## 2017-06-03 MED ORDER — SODIUM CHLORIDE 0.9 % IV SOLN
INTRAVENOUS | Status: DC
  Administered 2017-06-03 – 2017-06-04 (×4): via INTRAVENOUS

## 2017-06-03 MED ORDER — ONDANSETRON HCL 4 MG/2ML IJ SOLN: 4.0000 mg | Freq: Three times a day (TID) | INTRAMUSCULAR | Status: DC | PRN

## 2017-06-03 MED ORDER — METOPROLOL TARTRATE 50 MG PO TABS
50.0000 mg | ORAL_TABLET | Freq: Every day | ORAL | Status: DC
  Administered 2017-06-03 – 2017-06-05 (×3): 50 mg via ORAL
  Filled 2017-06-03 (×3): qty 1

## 2017-06-03 MED ORDER — METOPROLOL TARTRATE 50 MG PO TABS: 50.0000 mg | ORAL_TABLET | Freq: Two times a day (BID) | ORAL | Status: DC

## 2017-06-03 MED ORDER — OXYCODONE-ACETAMINOPHEN 5-325 MG PO TABS: 1.0000 | ORAL_TABLET | ORAL | Status: DC | PRN

## 2017-06-03 NOTE — H&P (Signed)
History and Physical    James Dominguez ZOX:[id number] DOB: September 03, 1981 DOA: 06/03/2017  PCP: Jamal Collin, PA-C  Patient coming from home  I have personally briefly reviewed patient's old medical records in [facility] Link  Chief Complaint: bee bite  HPI: James Dominguez is a 36 y.o. male admitted with bee bites.he was mowing the lawn when he ran intothe bee nest and had multiple bites to his body two weeks ago.the bite on the abdominal wall becamemore and more red.he was started on keflex by pcp.did not make any improvement.no fever chills nausea vomiting diarrhea.he was also on steroids taper for the bites.  ED Course: received k replacement.clindamycin and ivf.  Review of Systems: As per HPI otherwise 10 point review of systems negative.    Past Medical History:  Diagnosis Date  . Asthma   . Hypertension     History reviewed. No pertinent surgical history.   reports that he has never smoked. He has never used smokeless tobacco. He reports that he does not drink alcohol or use drugs.  No Known Allergies  History reviewed. No pertinent family history. Unacceptable: Noncontributory, unremarkable, or negative. Acceptable: Family history reviewed and not pertinent (If you reviewed it)  Prior to Admission medications   Medication Sig Start Date End Date Taking? Authorizing Provider  albuterol (PROVENTIL HFA;VENTOLIN HFA) 108 (90 Base) MCG/ACT inhaler Inhale 2 puffs into the lungs every 4 (four) hours as needed for wheezing or shortness of breath. 12/30/15  Yes Mabe, Onalee Hua, NP  metoprolol tartrate (LOPRESSOR) 50 MG tablet Take 50-100 mg by mouth 2 (two) times daily. Take 100 mg by mouth every morning and 50 mg evening the evening 05/09/17  Yes [provider]  perindopril (ACEON) 8 MG tablet Take 8 mg by mouth daily. 05/09/17  Yes [provider]  predniSONE (DELTASONE) 20 MG tablet 3 Tabs PO Days 1-3, then 2 tabs PO Days 4-6, then 1 tab PO Day 7-9, then Half Tab PO  Day 10-12 Patient not taking: Reported on 06/03/2017 12/30/15   Hayden Rasmussen, NP    Physical Exam: Vitals:   06/03/17 0221 06/03/17 0531 06/03/17 0532 06/03/17 0654  BP: 128/82 (!) 166/108 (!) 166/108 (!) 137/92  Pulse: 72 89 85 76  Resp: 18 20  16   Temp:    98.2 F (36.8 C)  TempSrc:    Oral  SpO2: 96% 98% 99% 99%  Weight:      Height:        Constitutional: NAD, calm, comfortable Vitals:   06/03/17 0221 06/03/17 0531 06/03/17 0532 06/03/17 0654  BP: 128/82 (!) 166/108 (!) 166/108 (!) 137/92  Pulse: 72 89 85 76  Resp: 18 20  16   Temp:    98.2 F (36.8 C)  TempSrc:    Oral  SpO2: 96% 98% 99% 99%  Weight:      Height:       Eyes: PERRL, lids and conjunctivae normal ENMT: Mucous membranes are moist. Posterior pharynx clear of any exudate or lesions.Normal dentition.  Neck: normal, supple, no masses, no thyromegaly Respiratory: clear to auscultation bilaterally, no wheezing, no crackles. Normal respiratory effort. No accessory muscle use.  Cardiovascular: Regular rate and rhythm, no murmurs / rubs / gallops. No extremity edema. 2+ pedal pulses. No carotid bruits.  Abdomen: no tenderness, no masses palpated. No hepatosplenomegaly. Bowel sounds positive.  Musculoskeletal: no clubbing / cyanosis. No joint deformity upper and lower extremities. Good ROM, no contractures. Normal muscle tone.  Skin: bite and erythema noted on  the left abdominal wall. Neurologic: CN 2-12 grossly intact. Sensation intact, DTR normal. Strength 5/5 in all 4.  Psychiatric: Normal judgment and insight. Alert and oriented x 3. Normal mood.   (Anything < 9 systems with 2 bullets each down codes to level 1) (If patient refuses exam can't bill higher level) (Make sure to document decubitus ulcers present on admission -- if possible -- and whether patient has chronic indwelling catheter at time of admission)  Labs on Admission: I have personally reviewed following labs and imaging studies  CBC:  Recent  Labs Lab 06/03/17 0111  WBC 11.6*  NEUTROABS 6.8  HGB 14.8  HCT 43.7  MCV 85.2  PLT 222   Basic Metabolic Panel:  Recent Labs Lab 06/03/17 0111  NA 135  K 3.2*  CL 100*  CO2 28  GLUCOSE 91  BUN 18  CREATININE 0.83  CALCIUM 8.6*   GFR: Estimated Creatinine Clearance: 176.8 mL/min (by C-G formula based on SCr of 0.83 mg/dL). Liver Function Tests:  Recent Labs Lab 06/03/17 0111  AST 20  ALT 24  ALKPHOS 70  BILITOT 0.7  PROT 8.2*  ALBUMIN 3.9   No results for input(s): LIPASE, AMYLASE in the last 168 hours. No results for input(s): AMMONIA in the last 168 hours. Coagulation Profile: No results for input(s): INR, PROTIME in the last 168 hours. Cardiac Enzymes: No results for input(s): CKTOTAL, CKMB, CKMBINDEX, TROPONINI in the last 168 hours. BNP (last 3 results) No results for input(s): PROBNP in the last 8760 hours. HbA1C: No results for input(s): HGBA1C in the last 72 hours. CBG: No results for input(s): GLUCAP in the last 168 hours. Lipid Profile: No results for input(s): CHOL, HDL, LDLCALC, TRIG, CHOLHDL, LDLDIRECT in the last 72 hours. Thyroid Function Tests: No results for input(s): TSH, T4TOTAL, FREET4, T3FREE, THYROIDAB in the last 72 hours. Anemia Panel: No results for input(s): VITAMINB12, FOLATE, FERRITIN, TIBC, IRON, RETICCTPCT in the last 72 hours. Urine analysis:    Component Value Date/Time   COLORURINE YELLOW 06/28/2008 1123   APPEARANCEUR CLEAR 06/28/2008 1123   LABSPEC 1.013 06/28/2008 1123   PHURINE 6.0 06/28/2008 1123   GLUCOSEU NEGATIVE 06/28/2008 1123   HGBUR SMALL (A) 06/28/2008 1123   BILIRUBINUR NEGATIVE 06/28/2008 1123   KETONESUR NEGATIVE 06/28/2008 1123   PROTEINUR NEGATIVE 06/28/2008 1123   UROBILINOGEN 1.0 06/28/2008 1123   NITRITE NEGATIVE 06/28/2008 1123   LEUKOCYTESUR NEGATIVE 06/28/2008 1123    Radiological Exams on Admission: No results found.  EKG: Independently reviewed.  Assessment/Plan Active Problems:    Cellulitis, abdominal wall  failed outpatient treatement with keflex.will treat with clindamycin.mildly elevated wbc.follow.  Hypokalemia k 3.2 repleted.follow.  HTN continue metoprolol and ace.       DVT prophylaxis:lovenox Code Status full Family Communication:  Disposition Plan:  Consults called:  Admission status: medical floor   Alwyn Ren MD [facility]  If 7PM-7AM, please contact night-coverage [email or web address] Password [facility]  06/03/2017, 11:38 AM

## 2017-06-03 NOTE — Progress Notes (Signed)
This is a no charge note  Transfer from [facility] per Dr. Corlis Leak  36 year old male with past medical history of asthma, hypertension, who presents with abdominal wall cellulitis in the left lower quadrant. Patient failed outpatient antibiotic treatment (keflex for 6 doses). Symptoms get worse.  Patient was found to have WBC 11.6, potassium 3.2, creatinine normal, no fever, no tachycardia, no tachypnea, oxygen saturation 96% on room air. Patient is admitted to MedSurg bed as inpatient. Clindamycin was started in the ED.  Please call manager of [facility] at [phone] when pt arrives to floor   Lorretta Harp, MD  [facility] Pager [phone]  If 7PM-7AM, please contact night-coverage [email or web address] Password [facility] 06/03/2017, 4:24 AM

## 2017-06-03 NOTE — Progress Notes (Signed)
Nurse paged hospitalist to make him aware of patients arrival from [facility].

## 2017-06-03 NOTE — ED Notes (Signed)
Contacted [facility] for transportation to [facility]

## 2017-06-03 NOTE — ED Provider Notes (Signed)
[facility] Note   CSN: [id number] Arrival date & time: 06/02/17  2204     History   Chief Complaint Chief Complaint  Patient presents with  . Cellulitis    HPI Jaymon Dudek is a 36 y.o. male.  HPI  Patient is a 36 year old male presenting with cellulitis to his abdomen. Patient ports that he had bug bites 2 weeks ago. The last 4 days has developed redness on his abdomen. Patient was started on Keflex yesterday by his primary care physician. Patient was taken 6 total doses. Patient's cellulitis has spread. No fevers, systemically appears well.  Past Medical History:  Diagnosis Date  . Asthma   . Hypertension     There are no active problems to display for this patient.   History reviewed. No pertinent surgical history.     Home Medications    Prior to Admission medications   Medication Sig Start Date End Date Taking? Authorizing Provider  lisinopril (PRINIVIL,ZESTRIL) 10 MG tablet Take 10 mg by mouth daily.   Yes [provider]  metoprolol succinate (TOPROL-XL) 100 MG 24 hr tablet Take 100 mg by mouth daily. Take with or immediately following a meal.   Yes [provider]  albuterol (PROVENTIL HFA;VENTOLIN HFA) 108 (90 Base) MCG/ACT inhaler Inhale 2 puffs into the lungs every 4 (four) hours as needed for wheezing or shortness of breath. 12/30/15   Hayden Rasmussen, NP  perindopril (ACEON) 2 MG tablet Take 2 mg by mouth daily.    [provider]  predniSONE (DELTASONE) 20 MG tablet 3 Tabs PO Days 1-3, then 2 tabs PO Days 4-6, then 1 tab PO Day 7-9, then Half Tab PO Day 10-12 12/30/15   Hayden Rasmussen, NP    Family History History reviewed. No pertinent family history.  Social History Social History  Substance Use Topics  . Smoking status: Never Smoker  . Smokeless tobacco: Never Used  . Alcohol use No     Allergies   Patient has no known allergies.   Review of Systems Review of Systems  Constitutional: Negative  for activity change, fatigue and fever.  Respiratory: Negative for shortness of breath.   Cardiovascular: Negative for chest pain.  Gastrointestinal: Negative for abdominal pain.  All other systems reviewed and are negative.    Physical Exam Updated Vital Signs BP (!) 146/89 (BP Location: Right Arm)   Pulse 92   Temp 98.3 F (36.8 C) (Oral)   Resp 18   Ht 5\' 8"  (1.727 m)   Wt (!) 156.5 kg (345 lb)   SpO2 100%   BMI 52.46 kg/m   Physical Exam  Constitutional: He is oriented to person, place, and time. He appears well-nourished.  HENT:  Head: Normocephalic.  Eyes: Conjunctivae are normal. Right eye exhibits no discharge. Left eye exhibits no discharge.  Cardiovascular: Normal rate.   Pulmonary/Chest: Effort normal and breath sounds normal. No respiratory distress.  Abdominal: Soft. Bowel sounds are normal. He exhibits no distension.  Neurological: He is oriented to person, place, and time.  Skin: Skin is warm and dry. Rash noted. He is not diaphoretic. There is erythema.  Psychiatric: He has a normal mood and affect. His behavior is normal.       ED Treatments / Results  Labs (all labs ordered are listed, but only abnormal results are displayed) Labs Reviewed  CBC WITH DIFFERENTIAL/PLATELET  COMPREHENSIVE METABOLIC PANEL    EKG  EKG Interpretation None       Radiology No  results found.  Procedures Procedures (including critical care time)  Medications Ordered in ED Medications  sodium chloride 0.9 % bolus 1,000 mL (not administered)  clindamycin (CLEOCIN) IVPB 600 mg (not administered)     Initial Impression / Assessment and Plan / ED Course  I have reviewed the triage vital signs and the nursing notes.  Pertinent labs & imaging results that were available during my care of the patient were reviewed by me and considered in my medical decision making (see chart for details).     Patient is a 36 year old male presenting with cellulitis to his  abdomen. Patient ports that he had bug bites 2 weeks ago. The last 4 days has developed redness on his abdomen. Patient was started on Keflex the day before yesterday by his primary care physician. Patient was taken 6 total doses. Patient's cellulitis has spread. No fevers, systemically appears well. Prcture above.   1:14 AM Patient has failed outpatient abx. Will initiate clindamycin.  [place] the abdomen, no evidence of abscess.  Now indurated.   EMERGENCY DEPARTMENT US SOFT TISSUE INTERPRETATION "Study: Limited Soft Tissue Ultrasound"  INDICATIONS: Soft tissue infection Multiple views of the body part were obtained in real-time with a multi-frequency linear probe PERFORMED BY:  Myself IMAGES ARCHIVED?: Yes SIDE:Left BODY PART:Abdominal wall and Pelvic wall FINDINGS: No abcess noted INTERPRETATION:  Cellulitis present      Final Clinical Impressions(s) / ED Diagnoses   Final diagnoses:  None    New Prescriptions New Prescriptions   No medications on file     Abelino Derrick, MD 06/03/17 [phone]

## 2017-06-04 ENCOUNTER — Inpatient Hospital Stay (HOSPITAL_COMMUNITY): Payer: 59

## 2017-06-04 DIAGNOSIS — E875 Hyperkalemia: Secondary | ICD-10-CM

## 2017-06-04 DIAGNOSIS — I10 Essential (primary) hypertension: Secondary | ICD-10-CM

## 2017-06-04 LAB — CBC WITH DIFFERENTIAL/PLATELET
BASOS ABS: 0 10*3/uL (ref 0.0–0.1)
BASOS PCT: 0 %
EOS ABS: 0.4 10*3/uL (ref 0.0–0.7)
Eosinophils Relative: 3 %
HEMATOCRIT: 38.7 % — AB (ref 39.0–52.0)
Hemoglobin: 13.2 g/dL (ref 13.0–17.0)
Lymphocytes Relative: 28 %
Lymphs Abs: 3.2 10*3/uL (ref 0.7–4.0)
MCH: 29 pg (ref 26.0–34.0)
MCHC: 34.1 g/dL (ref 30.0–36.0)
MCV: 85.1 fL (ref 78.0–100.0)
MONO ABS: 1.2 10*3/uL — AB (ref 0.1–1.0)
Monocytes Relative: 10 %
NEUTROS ABS: 6.6 10*3/uL (ref 1.7–7.7)
NEUTROS PCT: 59 %
Platelets: 202 10*3/uL (ref 150–400)
RBC: 4.55 MIL/uL (ref 4.22–5.81)
RDW: 13 % (ref 11.5–15.5)
WBC: 11.4 10*3/uL — ABNORMAL HIGH (ref 4.0–10.5)

## 2017-06-04 LAB — BASIC METABOLIC PANEL
ANION GAP: 5 (ref 5–15)
BUN: 11 mg/dL (ref 6–20)
CALCIUM: 8.5 mg/dL — AB (ref 8.9–10.3)
CO2: 28 mmol/L (ref 22–32)
CREATININE: 0.82 mg/dL (ref 0.61–1.24)
Chloride: 103 mmol/L (ref 101–111)
Glucose, Bld: 108 mg/dL — ABNORMAL HIGH (ref 65–99)
Potassium: 4.2 mmol/L (ref 3.5–5.1)
Sodium: 136 mmol/L (ref 135–145)

## 2017-06-04 LAB — MRSA PCR SCREENING: MRSA BY PCR: NEGATIVE

## 2017-06-04 MED ORDER — ENOXAPARIN SODIUM 40 MG/0.4ML ~~LOC~~ SOLN
40.0000 mg | SUBCUTANEOUS | Status: DC
  Administered 2017-06-04 – 2017-06-05 (×2): 40 mg via SUBCUTANEOUS
  Filled 2017-06-04 (×2): qty 0.4

## 2017-06-04 NOTE — Progress Notes (Addendum)
PROGRESS NOTE [facility]   Korin Hartwell   ZOX:[id number] DOB: 05-19-1981  DOA: 06/03/2017 PCP: Jamal Collin, PA-C   Brief Narrative:  James Dominguez is a.-year-old male with significant past medical history of HTN,  presented with a abdominal erythema. Patient had a multiple be bites about 2 weeks ago that became erythematous, patient went to PCP got Rocephin x 1 and prescribed Keflex with no significant improvement. Upon ED the evaluation patient noted to have mild elevated white count. He was admitted for abdominal wall cellulitis and placed on IV antibiotics.   Subjective: Patient seen and examined, he doesn't have any complaint today. Report erythema slightly decreased. Denies any pain.  Assessment & Plan: Abdominal wall cellulitis  Failed outpatient treatment with Keflex Started on IV clindamycin will continue for now  Monitor WBC  Will get soft tissue US  Pain management as needed   Hypokalemia  Resolved   HTN  BP stable  Continue current regimen   Morbid obesity BMI 52 Follow up with PCP as outpatient  DVT prophylaxis: Lovenox Code Status: Full code Family Communication: Wife at bedside Disposition Plan: Home when medically stable  Consultants:   None  Procedures:   None  Antimicrobials: Anti-infectives    Start     Dose/Rate Route Frequency Ordered Stop   06/03/17 1300  clindamycin (CLEOCIN) IVPB 600 mg     600 mg 100 mL/hr over 30 Minutes Intravenous Every 8 hours 06/03/17 1135     06/03/17 0100  clindamycin (CLEOCIN) IVPB 600 mg     600 mg 100 mL/hr over 30 Minutes Intravenous  Once 06/03/17 0059 06/03/17 0204           Objective: Vitals:   06/03/17 2153 06/04/17 0456 06/04/17 1038 06/04/17 1304  BP: 131/80 (!) 110/57 119/74 (!) 131/95  Pulse: 77 (!) 59 79 65  Resp: 15 15  16   Temp: 98.1 F (36.7 C) 98 F (36.7 C)  98.6 F (37 C)  TempSrc: Oral Oral  Oral  SpO2: 100% 100%  98%  Weight:      Height:        Intake/Output  Summary (Last 24 hours) at 06/04/17 1806 Last data filed at 06/04/17 1351  Gross per 24 hour  Intake          4015.83 ml  Output                0 ml  Net          4015.83 ml   Filed Weights   06/02/17 2220  Weight: (!) 156.5 kg (345 lb)    Examination:  General exam: Appears calm and comfortable  HEENT: AC/AT, PERRLA, OP moist and clear Respiratory system: Clear to auscultation. No wheezes,crackle or rhonchi Cardiovascular system: S1 & S2 heard, RRR. No JVD, murmurs, rubs or gallops Gastrointestinal system: Abdomen is nondistended, soft and nontender. No organomegaly or masses felt. Normal bowel sounds heard. See picture Central nervous system: Alert and oriented. No focal neurological deficits. Extremities: No pedal edema. Symmetric, strength 5/5   Psychiatry: Judgement and insight appear normal. Mood & affect appropriate.  Skin:     Data Reviewed: I have personally reviewed following labs and imaging studies  CBC:  Recent Labs Lab 06/03/17 0111 06/04/17 0359  WBC 11.6* 11.4*  NEUTROABS 6.8 6.6  HGB 14.8 13.2  HCT 43.7 38.7*  MCV 85.2 85.1  PLT 222 202   Basic Metabolic Panel:  Recent Labs Lab 06/03/17 0111 06/04/17 0359  NA 135  136  K 3.2* 4.2  CL 100* 103  CO2 28 28  GLUCOSE 91 108*  BUN 18 11  CREATININE 0.83 0.82  CALCIUM 8.6* 8.5*   GFR: Estimated Creatinine Clearance: 179 mL/min (by C-G formula based on SCr of 0.82 mg/dL). Liver Function Tests:  Recent Labs Lab 06/03/17 0111  AST 20  ALT 24  ALKPHOS 70  BILITOT 0.7  PROT 8.2*  ALBUMIN 3.9   No results for input(s): LIPASE, AMYLASE in the last 168 hours. No results for input(s): AMMONIA in the last 168 hours. Coagulation Profile: No results for input(s): INR, PROTIME in the last 168 hours. Cardiac Enzymes: No results for input(s): CKTOTAL, CKMB, CKMBINDEX, TROPONINI in the last 168 hours. BNP (last 3 results) No results for input(s): PROBNP in the last 8760 hours. HbA1C: No results  for input(s): HGBA1C in the last 72 hours. CBG: No results for input(s): GLUCAP in the last 168 hours. Lipid Profile: No results for input(s): CHOL, HDL, LDLCALC, TRIG, CHOLHDL, LDLDIRECT in the last 72 hours. Thyroid Function Tests: No results for input(s): TSH, T4TOTAL, FREET4, T3FREE, THYROIDAB in the last 72 hours. Anemia Panel: No results for input(s): VITAMINB12, FOLATE, FERRITIN, TIBC, IRON, RETICCTPCT in the last 72 hours. Sepsis Labs:  Recent Labs Lab 06/03/17 0516  LATICACIDVEN 0.62    Recent Results (from the past 240 hour(s))  Culture, blood (Routine X 2) w Reflex to ID Panel     Status: None (Preliminary result)   Collection Time: 06/03/17  5:03 AM  Result Value Ref Range Status   Specimen Description BLOOD LEFT ARM  Final   Special Requests   Final    BOTTLES DRAWN AEROBIC AND ANAEROBIC Blood Culture adequate volume   Culture   Final    NO GROWTH 1 DAY Performed at [facility], [address], [place], [place] [address]    Report Status PENDING  Incomplete  MRSA PCR Screening     Status: None   Collection Time: 06/04/17 12:33 PM  Result Value Ref Range Status   MRSA by PCR NEGATIVE NEGATIVE Final    Comment:        The GeneXpert MRSA Assay (FDA approved for NASAL specimens only), is one component of a comprehensive MRSA colonization surveillance program. It is not intended to diagnose MRSA infection nor to guide or monitor treatment for MRSA infections.       Radiology Studies: US Abdomen Limited  Result Date: 06/04/2017 CLINICAL DATA:  Cellulitis. EXAM: ULTRASOUND ABDOMEN LIMITED COMPARISON:  No prior. FINDINGS: 0.8 x 0.9 x 1.2 cm tiny complex fluid collection under open wound. This could represent a a small superficial abscess. Small hematoma could present this fashion. No large fluid collections noted. IMPRESSION: 1.2 cm tiny complex fluid collection noted under the open wound. This could represent a small superficial abscess or small  hematoma. No large fluid collections are noted. Electronically Signed   By: Maisie Fus  Register   On: 06/04/2017 15:08      Scheduled Meds: . acidophilus  2 capsule Oral Daily  . enoxaparin (LOVENOX) injection  40 mg Subcutaneous Q24H  . metoprolol tartrate  100 mg Oral Daily   And  . metoprolol tartrate  50 mg Oral QHS  . trandolapril  1 mg Oral Daily   Continuous Infusions: . clindamycin (CLEOCIN) IV Stopped (06/04/17 1532)     LOS: 1 day    Time spent: Total of 15 minutes spent with pt, greater than 50% of which was spent in  discussion of  treatment, counseling and coordination of care    Latrelle Dodrill, MD Pager: Text Page via [email or web address]   If 7PM-7AM, please contact night-coverage [email or web address] 06/04/2017, 6:06 PM

## 2017-06-05 DIAGNOSIS — Z6841 Body Mass Index (BMI) 40.0 and over, adult: Secondary | ICD-10-CM

## 2017-06-05 LAB — CBC
HCT: 40.4 % (ref 39.0–52.0)
HEMOGLOBIN: 13.9 g/dL (ref 13.0–17.0)
MCH: 29.1 pg (ref 26.0–34.0)
MCHC: 34.4 g/dL (ref 30.0–36.0)
MCV: 84.5 fL (ref 78.0–100.0)
PLATELETS: 148 10*3/uL — AB (ref 150–400)
RBC: 4.78 MIL/uL (ref 4.22–5.81)
RDW: 12.9 % (ref 11.5–15.5)
WBC: 8.3 10*3/uL (ref 4.0–10.5)

## 2017-06-05 NOTE — Consult Note (Signed)
WOC Nurse wound consult note Reason for Consult: left lower abdomen abscess, small. Initial insult was bee sting approximately 2 weeks ago. Wound type: Infectious Pressure Injury POA: N/A Measurement:2cm x 2.5cm with pinpoint opening in center, 0.1cm depth Wound bed:Red, dry Drainage (amount, consistency, odor) scant serous to light yellow Periwound: Resolving erythema Dressing procedure/placement/frequency: I will initiate saline moist to dry dressings three times daily. Patient may benefit from showering with antimicrobial soap when approved by MD.  If you agree, please order. WOC nursing team will not follow, but will remain available to this patient, the nursing and medical teams.  Please re-consult if needed. Thanks, Ladona Mow, MSN, RN, GNP, [place]  Pager# [phone]

## 2017-06-05 NOTE — Consult Note (Signed)
   [facility] Inpatient Consult   06/05/2017  James Dominguez 1981-07-14 [id number]   Spoke with Mr. Juenemann and wife at bedside on behalf for [facility] Care Management/Link to Wellness program for [facility] employees/dependents with [facility] insurance.   Explained Link to [employer]. Mr. Manuele wife is a Runner, broadcasting/film/video.   Mr. Venecia denies having Link to Wellness needs. Appreciative of Link to [employer], 24-hr nurse line magnet, and contact information.  Confirmed best contact number as [phone] for post hospital discharge call.    Raiford Noble, MSN-Ed, RN,BSN [facility] Liaison [phone]

## 2017-06-05 NOTE — Progress Notes (Signed)
PROGRESS NOTE [facility]   Lieutenant Palermo   TML:[id number] DOB: 09-28-1981  DOA: 06/03/2017 PCP: Jamal Collin, PA-C   Brief Narrative:  James Dominguez is a.-year-old male with significant past medical history of HTN,  presented with a abdominal erythema. Patient had a multiple be bites about 2 weeks ago that became erythematous, patient went to PCP got Rocephin x 1 and prescribed Keflex with no significant improvement. Upon ED the evaluation patient noted to have mild elevated white count. He was admitted for abdominal wall cellulitis and placed on IV antibiotics.   Subjective: Patient does not have any complaints, erythema continues to decrease still some tenderness. Now with mild drainage from the ulceration. No acute events overnight patient remains afebrile.  Assessment & Plan: Abdominal wall cellulitis  Failed outpatient treatment with Keflex Continue IV clindamycin, if continued to improve can switch to oral formulation and d/c in AM to complete total of 7-10 days of antibiotic Soft tissue US with mild fluid collection  I squeezed out some fluid from the ulcerated area, purulent and serosanguineous material  Wound care consulted  WBC normalized  Continue pain control as needed    Hypokalemia  Resolved   HTN  BP stable  Continue current regimen   Morbid obesity BMI 52 Follow up with PCP as outpatient  DVT prophylaxis: Lovenox Code Status: Full code Family Communication: Wife at bedside Disposition Plan: Expect to d/c home in the next 24 hrs   Consultants:   None  Procedures:   None  Antimicrobials: Anti-infectives    Start     Dose/Rate Route Frequency Ordered Stop   06/03/17 1300  clindamycin (CLEOCIN) IVPB 600 mg     600 mg 100 mL/hr over 30 Minutes Intravenous Every 8 hours 06/03/17 1135     06/03/17 0100  clindamycin (CLEOCIN) IVPB 600 mg     600 mg 100 mL/hr over 30 Minutes Intravenous  Once 06/03/17 0059 06/03/17 0204           Objective: Vitals:   06/04/17 1038 06/04/17 1304 06/04/17 2026 06/05/17 0447  BP: 119/74 (!) 131/95 (!) 144/88 128/87  Pulse: 79 65 74 61  Resp:  16 18 16   Temp:  98.6 F (37 C) 98.4 F (36.9 C) 98.1 F (36.7 C)  TempSrc:  Oral Oral Oral  SpO2:  98% 98% 100%  Weight:      Height:        Intake/Output Summary (Last 24 hours) at 06/05/17 1039 Last data filed at 06/05/17 0600  Gross per 24 hour  Intake          1801.25 ml  Output                0 ml  Net          1801.25 ml   Filed Weights   06/02/17 2220  Weight: (!) 156.5 kg (345 lb)    Examination:  General: Pt is alert, awake, not in acute distress Cardiovascular: RRR, S1/S2 +, no rubs, no gallops Respiratory: CTA bilaterally, no wheezing, no rhonchi Abdominal: Soft, NT, ND, bowel sounds + Extremities: no edema Skin:  06/04/17   Data Reviewed: I have personally reviewed following labs and imaging studies  CBC:  Recent Labs Lab 06/03/17 0111 06/04/17 0359 06/05/17 0457  WBC 11.6* 11.4* 8.3  NEUTROABS 6.8 6.6  --   HGB 14.8 13.2 13.9  HCT 43.7 38.7* 40.4  MCV 85.2 85.1 84.5  PLT 222 202 148*   Basic Metabolic Panel:  Recent Labs Lab 06/03/17 0111 06/04/17 0359  NA 135 136  K 3.2* 4.2  CL 100* 103  CO2 28 28  GLUCOSE 91 108*  BUN 18 11  CREATININE 0.83 0.82  CALCIUM 8.6* 8.5*   GFR: Estimated Creatinine Clearance: 179 mL/min (by C-G formula based on SCr of 0.82 mg/dL). Liver Function Tests:  Recent Labs Lab 06/03/17 0111  AST 20  ALT 24  ALKPHOS 70  BILITOT 0.7  PROT 8.2*  ALBUMIN 3.9   No results for input(s): LIPASE, AMYLASE in the last 168 hours. No results for input(s): AMMONIA in the last 168 hours. Coagulation Profile: No results for input(s): INR, PROTIME in the last 168 hours. Cardiac Enzymes: No results for input(s): CKTOTAL, CKMB, CKMBINDEX, TROPONINI in the last 168 hours. BNP (last 3 results) No results for input(s): PROBNP in the last 8760 hours. HbA1C: No  results for input(s): HGBA1C in the last 72 hours. CBG: No results for input(s): GLUCAP in the last 168 hours. Lipid Profile: No results for input(s): CHOL, HDL, LDLCALC, TRIG, CHOLHDL, LDLDIRECT in the last 72 hours. Thyroid Function Tests: No results for input(s): TSH, T4TOTAL, FREET4, T3FREE, THYROIDAB in the last 72 hours. Anemia Panel: No results for input(s): VITAMINB12, FOLATE, FERRITIN, TIBC, IRON, RETICCTPCT in the last 72 hours. Sepsis Labs:  Recent Labs Lab 06/03/17 0516  LATICACIDVEN 0.62    Recent Results (from the past 240 hour(s))  Culture, blood (Routine X 2) w Reflex to ID Panel     Status: None (Preliminary result)   Collection Time: 06/03/17  5:03 AM  Result Value Ref Range Status   Specimen Description BLOOD LEFT ARM  Final   Special Requests   Final    BOTTLES DRAWN AEROBIC AND ANAEROBIC Blood Culture adequate volume   Culture   Final    NO GROWTH 1 DAY Performed at [facility], [address], [place], [place] [address]    Report Status PENDING  Incomplete  MRSA PCR Screening     Status: None   Collection Time: 06/04/17 12:33 PM  Result Value Ref Range Status   MRSA by PCR NEGATIVE NEGATIVE Final    Comment:        The GeneXpert MRSA Assay (FDA approved for NASAL specimens only), is one component of a comprehensive MRSA colonization surveillance program. It is not intended to diagnose MRSA infection nor to guide or monitor treatment for MRSA infections.       Radiology Studies: US Abdomen Limited  Result Date: 06/04/2017 CLINICAL DATA:  Cellulitis. EXAM: ULTRASOUND ABDOMEN LIMITED COMPARISON:  No prior. FINDINGS: 0.8 x 0.9 x 1.2 cm tiny complex fluid collection under open wound. This could represent a a small superficial abscess. Small hematoma could present this fashion. No large fluid collections noted. IMPRESSION: 1.2 cm tiny complex fluid collection noted under the open wound. This could represent a small superficial abscess or small  hematoma. No large fluid collections are noted. Electronically Signed   By: Maisie Fus  Register   On: 06/04/2017 15:08      Scheduled Meds: . acidophilus  2 capsule Oral Daily  . enoxaparin (LOVENOX) injection  40 mg Subcutaneous Q24H  . metoprolol tartrate  100 mg Oral Daily   And  . metoprolol tartrate  50 mg Oral QHS  . trandolapril  1 mg Oral Daily   Continuous Infusions: . clindamycin (CLEOCIN) IV 600 mg (06/05/17 0621)     LOS: 2 days    Time spent: Total of 15 minutes  spent with pt, greater than 50% of which was spent in discussion of  treatment, counseling and coordination of care    Latrelle Dodrill, MD Pager: Text Page via [email or web address]   If 7PM-7AM, please contact night-coverage [email or web address] 06/05/2017, 10:39 AM

## 2017-06-06 MED ORDER — RISAQUAD PO CAPS: 2.0000 | ORAL_CAPSULE | Freq: Every day | ORAL | 0 refills | Status: AC

## 2017-06-06 MED ORDER — KETOROLAC TROMETHAMINE 10 MG PO TABS: 10.0000 mg | ORAL_TABLET | Freq: Four times a day (QID) | ORAL | 0 refills | Status: AC | PRN

## 2017-06-06 MED ORDER — CLINDAMYCIN HCL 300 MG PO CAPS: 300.0000 mg | ORAL_CAPSULE | Freq: Three times a day (TID) | ORAL | 0 refills | Status: AC

## 2017-06-06 MED FILL — CLINDAMYCIN HCL 300 MG CAPS: 300 | 4 days supply | Qty: 12 | Fill #0

## 2017-06-06 NOTE — Discharge Summary (Signed)
Physician Discharge Summary  James Dominguez ZOX:[id number] DOB: [birth year] DOA: 06/03/2017  PCP: Jamal Collin, PA-C  Admit date: 06/03/2017 Discharge date: 06/06/2017  Admitted From: Home Disposition:  Home  Recommendations for Outpatient Follow-up:  1. Follow up with PCP in 1-2 weeks  Discharge Condition:Improved CODE STATUS:Full Diet recommendation: Regular   Brief/Interim Summary: 36 year-old male with significant past medical history of HTN,  presented with a abdominal erythema. Patient had a multiple be bites about 2 weeks ago that became erythematous, patient went to PCP got Rocephin x 1 and prescribed Keflex with no significant improvement. Upon ED the evaluation patient noted to have mild elevated white count. He was admitted for abdominal wall cellulitis and placed on IV antibiotics.   Abdominal wall cellulitis  Failed outpatient treatment with Keflex Patient was continued on IV clindamycin with improvement in symptoms Soft tissue US with mild fluid collection  Combination of purulent and serosanguineous material expressed from wound Wound care was consulted  WBC had normalized   Hypokalemia  Resolved   HTN  BP stable  Continue current regimen   Morbid obesity BMI 52 Follow up with PCP as outpatient  Discharge Diagnoses:  Active Problems:   Cellulitis, abdominal wall    Discharge Instructions  Discharge Instructions    AMB Referral to [facility] Care Management    Complete by:  As directed    Please assign UMR member for post discharge call. Currently at [facility]. Thanks. Raiford Noble, MSN-Ed, [facility] Liaison-[phone]   Reason for consult:  Please assign UMR member for post discharge call   Expected date of contact:  1-3 days (reserved for hospital discharges)     Allergies as of 06/06/2017   No Known Allergies     Medication List    STOP taking these medications   predniSONE 20 MG tablet Commonly known as:   DELTASONE     TAKE these medications   acidophilus Caps capsule Take 2 capsules by mouth daily.   albuterol 108 (90 Base) MCG/ACT inhaler Commonly known as:  PROVENTIL HFA;VENTOLIN HFA Inhale 2 puffs into the lungs every 4 (four) hours as needed for wheezing or shortness of breath.   clindamycin 300 MG capsule Commonly known as:  CLEOCIN Take 1 capsule (300 mg total) by mouth 3 (three) times daily.   ketorolac 10 MG tablet Commonly known as:  TORADOL Take 1 tablet (10 mg total) by mouth every 6 (six) hours as needed.   metoprolol tartrate 50 MG tablet Commonly known as:  LOPRESSOR Take 50-100 mg by mouth 2 (two) times daily. Take 100 mg by mouth every morning and 50 mg evening the evening   perindopril 8 MG tablet Commonly known as:  ACEON Take 8 mg by mouth daily.            Discharge Care Instructions        Start     Ordered   06/07/17 0000  acidophilus (RISAQUAD) CAPS capsule  Daily     06/06/17 1219   06/06/17 0000  clindamycin (CLEOCIN) 300 MG capsule  3 times daily     06/06/17 1221   06/06/17 0000  ketorolac (TORADOL) 10 MG tablet  Every 6 hours PRN     06/06/17 1221   06/05/17 0000  AMB Referral to [facility] Care Management    Comments:  Please assign UMR member for post discharge call. Currently at [facility]. Thanks. Raiford Noble, MSN-Ed, [facility] Liaison-[phone]  Question Answer Comment  Reason  for consult Please assign UMR member for post discharge call   Expected date of contact 1-3 days (reserved for hospital discharges)      06/05/17 1351     Follow-up Information    Hedgecock, Rosalita Chessman, [place]. Schedule an appointment as soon as possible for a visit in 1 week(s).   Specialty:  Physician Assistant Contact information: [address] [place] [address] [phone]          No Known Allergies  Procedures/Studies: US Abdomen Limited  Result Date: 06/04/2017 CLINICAL DATA:  Cellulitis. EXAM:  ULTRASOUND ABDOMEN LIMITED COMPARISON:  No prior. FINDINGS: 0.8 x 0.9 x 1.2 cm tiny complex fluid collection under open wound. This could represent a a small superficial abscess. Small hematoma could present this fashion. No large fluid collections noted. IMPRESSION: 1.2 cm tiny complex fluid collection noted under the open wound. This could represent a small superficial abscess or small hematoma. No large fluid collections are noted. Electronically Signed   By: Maisie Fus  Register   On: 06/04/2017 15:08     Subjective: Without complaints. Reports feeling better  Discharge Exam: Vitals:   06/06/17 0600 06/06/17 1012  BP: 116/79 118/71  Pulse: 82 67  Resp: 18   Temp: 98 F (36.7 C)   SpO2: 99%    Vitals:   06/05/17 1425 06/05/17 2144 06/06/17 0600 06/06/17 1012  BP: 122/80 132/84 116/79 118/71  Pulse: 67 71 82 67  Resp: 18 18 18    Temp: 99 F (37.2 C) 97.8 F (36.6 C) 98 F (36.7 C)   TempSrc: Oral Oral Oral   SpO2: 97% 99% 99%   Weight:      Height:        General: Pt is alert, awake, not in acute distress Cardiovascular: RRR, S1/S2 +, no rubs, no gallops Respiratory: CTA bilaterally, no wheezing, no rhonchi Abdominal: Soft, NT, ND, bowel sounds + Extremities: no edema, no cyanosis   The results of significant diagnostics from this hospitalization (including imaging, microbiology, ancillary and laboratory) are listed below for reference.     Microbiology: Recent Results (from the past 240 hour(s))  Culture, blood (Routine X 2) w Reflex to ID Panel     Status: None (Preliminary result)   Collection Time: 06/03/17  5:03 AM  Result Value Ref Range Status   Specimen Description BLOOD LEFT ARM  Final   Special Requests   Final    BOTTLES DRAWN AEROBIC AND ANAEROBIC Blood Culture adequate volume   Culture   Final    NO GROWTH 3 DAYS Performed at [facility], [address] [place], [place] [address]    Report Status PENDING  Incomplete  MRSA PCR Screening      Status: None   Collection Time: 06/04/17 12:33 PM  Result Value Ref Range Status   MRSA by PCR NEGATIVE NEGATIVE Final    Comment:        The GeneXpert MRSA Assay (FDA approved for NASAL specimens only), is one component of a comprehensive MRSA colonization surveillance program. It is not intended to diagnose MRSA infection nor to guide or monitor treatment for MRSA infections.      Labs: BNP (last 3 results) No results for input(s): BNP in the last 8760 hours. Basic Metabolic Panel:  Recent Labs Lab 06/03/17 0111 06/04/17 0359  NA 135 136  K 3.2* 4.2  CL 100* 103  CO2 28 28  GLUCOSE 91 108*  BUN 18 11  CREATININE 0.83 0.82  CALCIUM 8.6* 8.5*   Liver Function Tests:  Recent Labs Lab 06/03/17 0111  AST 20  ALT 24  ALKPHOS 70  BILITOT 0.7  PROT 8.2*  ALBUMIN 3.9   No results for input(s): LIPASE, AMYLASE in the last 168 hours. No results for input(s): AMMONIA in the last 168 hours. CBC:  Recent Labs Lab 06/03/17 0111 06/04/17 0359 06/05/17 0457  WBC 11.6* 11.4* 8.3  NEUTROABS 6.8 6.6  --   HGB 14.8 13.2 13.9  HCT 43.7 38.7* 40.4  MCV 85.2 85.1 84.5  PLT 222 202 148*   Cardiac Enzymes: No results for input(s): CKTOTAL, CKMB, CKMBINDEX, TROPONINI in the last 168 hours. BNP: Invalid input(s): POCBNP CBG: No results for input(s): GLUCAP in the last 168 hours. D-Dimer No results for input(s): DDIMER in the last 72 hours. Hgb A1c No results for input(s): HGBA1C in the last 72 hours. Lipid Profile No results for input(s): CHOL, HDL, LDLCALC, TRIG, CHOLHDL, LDLDIRECT in the last 72 hours. Thyroid function studies No results for input(s): TSH, T4TOTAL, T3FREE, THYROIDAB in the last 72 hours.  Invalid input(s): FREET3 Anemia work up No results for input(s): VITAMINB12, FOLATE, FERRITIN, TIBC, IRON, RETICCTPCT in the last 72 hours. Urinalysis    Component Value Date/Time   COLORURINE YELLOW 06/28/2008 1123   APPEARANCEUR CLEAR 06/28/2008 1123    LABSPEC 1.013 06/28/2008 1123   PHURINE 6.0 06/28/2008 1123   GLUCOSEU NEGATIVE 06/28/2008 1123   HGBUR SMALL (A) 06/28/2008 1123   BILIRUBINUR NEGATIVE 06/28/2008 1123   KETONESUR NEGATIVE 06/28/2008 1123   PROTEINUR NEGATIVE 06/28/2008 1123   UROBILINOGEN 1.0 06/28/2008 1123   NITRITE NEGATIVE 06/28/2008 1123   LEUKOCYTESUR NEGATIVE 06/28/2008 1123   Sepsis Labs Invalid input(s): PROCALCITONIN,  WBC,  LACTICIDVEN Microbiology Recent Results (from the past 240 hour(s))  Culture, blood (Routine X 2) w Reflex to ID Panel     Status: None (Preliminary result)   Collection Time: 06/03/17  5:03 AM  Result Value Ref Range Status   Specimen Description BLOOD LEFT ARM  Final   Special Requests   Final    BOTTLES DRAWN AEROBIC AND ANAEROBIC Blood Culture adequate volume   Culture   Final    NO GROWTH 3 DAYS Performed at [facility], [address] [place], [place] [address]    Report Status PENDING  Incomplete  MRSA PCR Screening     Status: None   Collection Time: 06/04/17 12:33 PM  Result Value Ref Range Status   MRSA by PCR NEGATIVE NEGATIVE Final    Comment:        The GeneXpert MRSA Assay (FDA approved for NASAL specimens only), is one component of a comprehensive MRSA colonization surveillance program. It is not intended to diagnose MRSA infection nor to guide or monitor treatment for MRSA infections.      SIGNED:   Jerald Kief, MD  [facility] 06/06/2017, 12:21 PM  If 7PM-7AM, please contact night-coverage [email or web address] Password [email or web address]

## 2017-06-07 ENCOUNTER — Other Ambulatory Visit: Payer: Self-pay | Admitting: *Deleted

## 2017-06-07 NOTE — Patient Outreach (Signed)
[facility] Care Management  06/07/2017  James Dominguez September 05, 1981 [id number]  Subjective: Telephone call to patient's home  / mobile number, no answer, left HIPAA compliant voicemail message, and requested call back.   Objective: Per chart review, patient hospitalized 06/03/17 - 06/06/17 for Abdominal wall cellulitis.  Patient also has a history of hypertension.   Assessment: Received UMR Transition of care referral on 06/04/17.  Transition of care follow up pending patient contact.    Plan: RNCM will call patient for 2nd telephone outreach attempt, transition of care follow up, within 10 business days if no return call.    Bach Rocchi H. Gardiner Barefootooper RN, BSN, CCM [facility] Care [facility] Telephonic CM Phone: [phone] Fax: [phone]

## 2017-06-08 ENCOUNTER — Other Ambulatory Visit: Payer: Self-pay | Admitting: *Deleted

## 2017-06-08 ENCOUNTER — Encounter: Payer: Self-pay | Admitting: *Deleted

## 2017-06-08 LAB — CULTURE, BLOOD (ROUTINE X 2)
Culture: NO GROWTH
Special Requests: ADEQUATE

## 2017-06-08 NOTE — Patient Outreach (Addendum)
[facility] Care Management  06/08/2017  James FishermanJames Desjardin August 24, 1981 [id number]  Subjective: Telephone call to patient's home / mobile number, spoke with patient, and HIPAA verified.  [facility] Transition of care follow up, patient voiced understanding, and is in agreement to follow up.   Patient states he is doing well, able to do own wound care, not have much pain, and has returned to work.   States he is currently working and will have to disconnect call when he gets an incoming call.  Patient states he has hypertension, asthma, and able to self manage.  Patient voices understanding of medical diagnosis and treatment plan.  States he has a follow up appointment with primary MD on 06/20/17 due to work schedule.  States he is accessing the following [facility] benefits: outpatient pharmacy.  States he picked up most important meds at the pharmacy due to lack of funds and will pick up other meds at a later time when funds available.   States he is able to use Motrin over the counter as needed for pain.  Patient staes he has an incoming call and had to disconnect call.  States he is very appreciative of the follow up and is in agreement to receive [facility] Care [facility] information.    Objective: Per chart review, patient hospitalized 06/03/17 - 06/06/17 for Abdominal wall cellulitis.  Patient also has a history of hypertension.   Assessment: Received UMR Transition of care referral on 06/04/17.  Transition of care follow up completed, no care management needs, and will proceed with case closure.     Plan: RNCM will send patient successful outreach letter, [facility] pamph[facility], and magnet. RNCM will send case closure due to follow up completed / no care management needs request to Iverson AlaminLaura Greeson [facility].   Vittoria Noreen H. Gardiner Barefootooper RN, BSN, CCM [facility] Care Management [facility] Telephonic CM Phone: [phone] Fax: [phone]

## 2017-06-18 MED FILL — PERINDOPRIL ERBUMINE 8 MG T: 8 | 30 days supply | Qty: 30 | Fill #2

## 2017-06-20 DIAGNOSIS — L03311 Cellulitis of abdominal wall: Secondary | ICD-10-CM | POA: Diagnosis not present

## 2017-06-20 DIAGNOSIS — I10 Essential (primary) hypertension: Secondary | ICD-10-CM | POA: Diagnosis not present

## 2017-06-20 DIAGNOSIS — Z87891 Personal history of nicotine dependence: Secondary | ICD-10-CM | POA: Diagnosis not present

## 2017-06-20 DIAGNOSIS — Z09 Encounter for follow-up examination after completed treatment for conditions other than malignant neoplasm: Secondary | ICD-10-CM | POA: Diagnosis not present

## 2017-07-04 MED FILL — METOPROLOL TARTRATE 50 MG T: 50 | 30 days supply | Qty: 90 | Fill #5

## 2017-08-22 MED FILL — PERINDOPRIL ERBUMINE 8 MG T: 8 | 30 days supply | Qty: 30 | Fill #0

## 2017-08-22 MED FILL — METOPROLOL TARTRATE 50 MG T: 50 | 30 days supply | Qty: 90 | Fill #0

## 2017-08-27 MED FILL — CLINDAMYCIN HCL 300 MG CAPS: 300 | 10 days supply | Qty: 30 | Fill #0

## 2017-10-05 MED FILL — METOPROLOL TARTRATE 50 MG T: 50 | 30 days supply | Qty: 90 | Fill #1

## 2017-10-05 MED FILL — PERINDOPRIL ERBUMINE 8 MG T: 8 | 30 days supply | Qty: 30 | Fill #1

## 2017-11-21 MED FILL — METOPROLOL TARTRATE 50 MG T: 50 | 30 days supply | Qty: 90 | Fill #2

## 2017-11-22 MED FILL — CEPHALEXIN 500 MG CAPSULE: 500 | 7 days supply | Qty: 21 | Fill #0

## 2017-11-22 MED FILL — NEO/POLY/DEXAMET EYE OINT: 3.5-10000-0 | 7 days supply | Qty: 4 | Fill #0

## 2017-11-30 MED FILL — OLMESARTAN-HCTZ 20-12.5 MG: 20-12.5 | 30 days supply | Qty: 30 | Fill #0

## 2017-11-30 MED FILL — MUPIROCIN 2% OINTMENT: 2 | 7 days supply | Qty: 22 | Fill #0

## 2017-11-30 MED FILL — OLMESARTAN MEDOXOMIL 20 MG: 20 | 30 days supply | Qty: 30 | Fill #0

## 2017-12-05 MED FILL — CLINDAMYCIN HCL 300 MG CAPS: 300 | 10 days supply | Qty: 30 | Fill #0

## 2017-12-25 MED FILL — METOPROLOL TARTRATE 100 MG: 100 | 30 days supply | Qty: 60 | Fill #0

## 2017-12-25 MED FILL — OLMESARTAN-HCTZ 20-12.5 MG: 20-12.5 | 30 days supply | Qty: 30 | Fill #0

## 2017-12-25 MED FILL — OLMESARTAN MEDOXOMIL 20 MG: 20 | 30 days supply | Qty: 30 | Fill #0

## 2018-02-05 MED FILL — traMADol HCL 50 MG TABS: 50 | 7 days supply | Qty: 7 | Fill #0

## 2018-02-05 MED FILL — MELOXICAM 15 MG TABLET: 15 | 20 days supply | Qty: 20 | Fill #0

## 2018-02-07 MED FILL — OLMESARTAN-HCTZ 20-12.5 MG: 20-12.5 | 30 days supply | Qty: 30 | Fill #1

## 2018-02-07 MED FILL — METOPROLOL TARTRATE 100 MG: 100 | 30 days supply | Qty: 60 | Fill #1

## 2018-03-08 MED FILL — OLMESARTAN-HCTZ 20-12.5 MG: 20-12.5 | 30 days supply | Qty: 30 | Fill #1

## 2018-03-19 MED FILL — TRIAMCINOLONE 0.1% CREAM: 0.1 | 14 days supply | Qty: 80 | Fill #0

## 2018-03-19 MED FILL — KETOCONAZOLE 2% CREAM: 2 | 14 days supply | Qty: 30 | Fill #0

## 2018-03-19 MED FILL — CLINDAMYCIN PH 1% SOLUTION: 1 | 30 days supply | Qty: 30 | Fill #0

## 2018-03-26 IMAGING — US US ABDOMEN LIMITED
1 series · 14 of 25 positions shown · non-contrast
Comparison: No prior.

CLINICAL DATA: Cellulitis.

EXAM:
ULTRASOUND ABDOMEN LIMITED

[Series 1: us abdomen limited · 0.13mm/px · 14 of 28 slices shown]
[im 1/28]
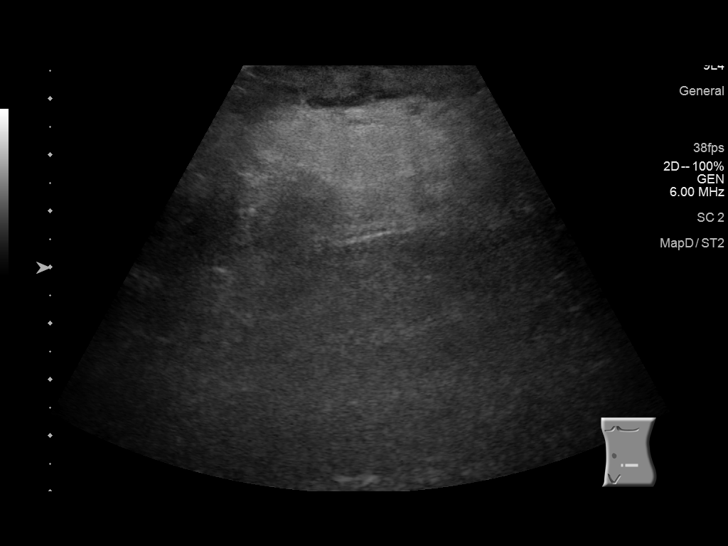
[im 3/28]
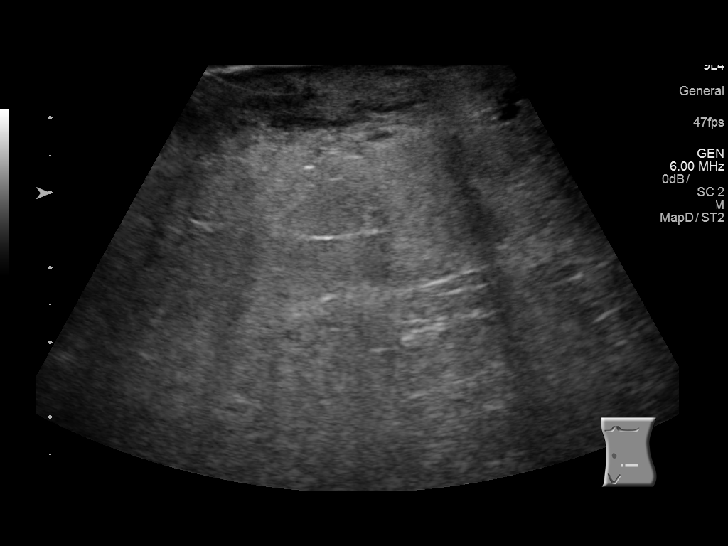
[im 5/28]
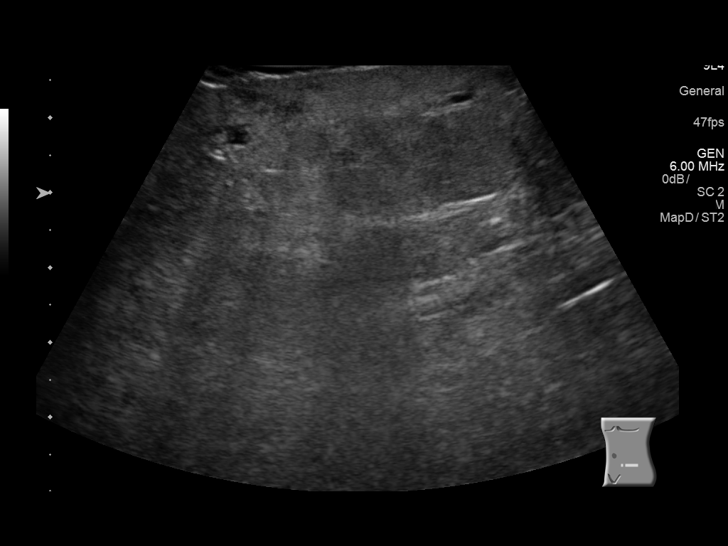
[im 7/28]
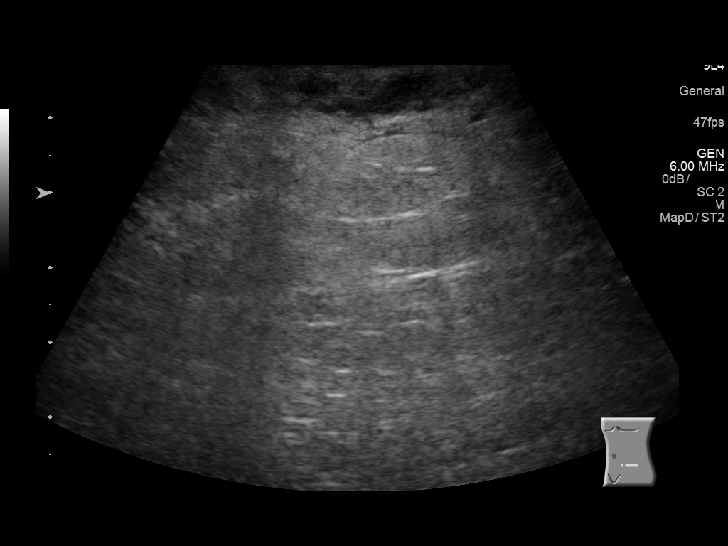
[im 10/28]
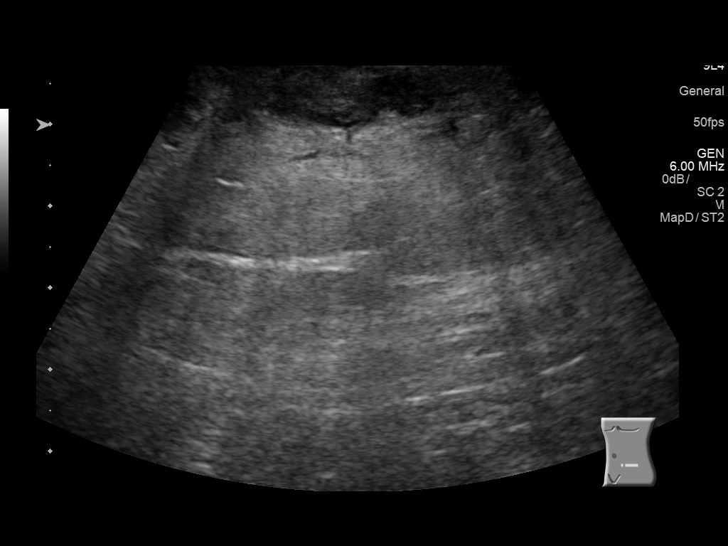
[im 11/28]
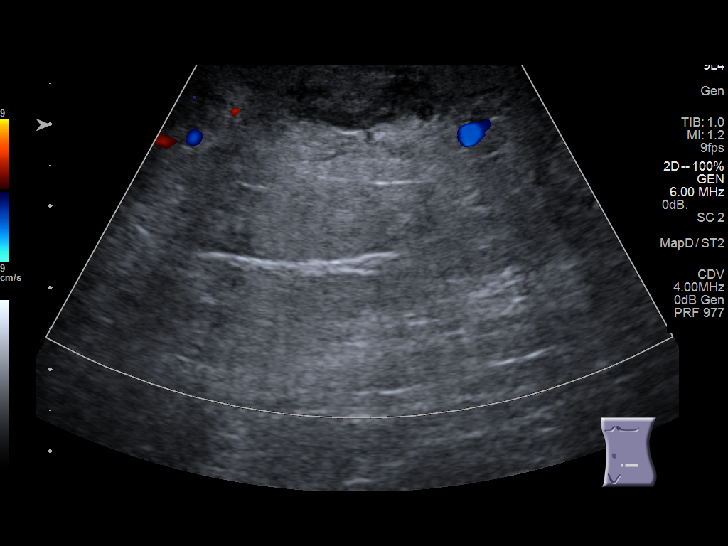
[im 13/28]
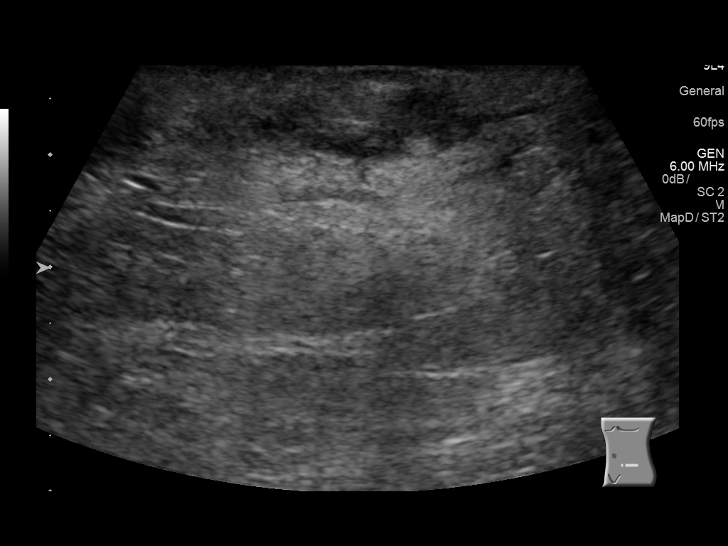
[im 15/28]
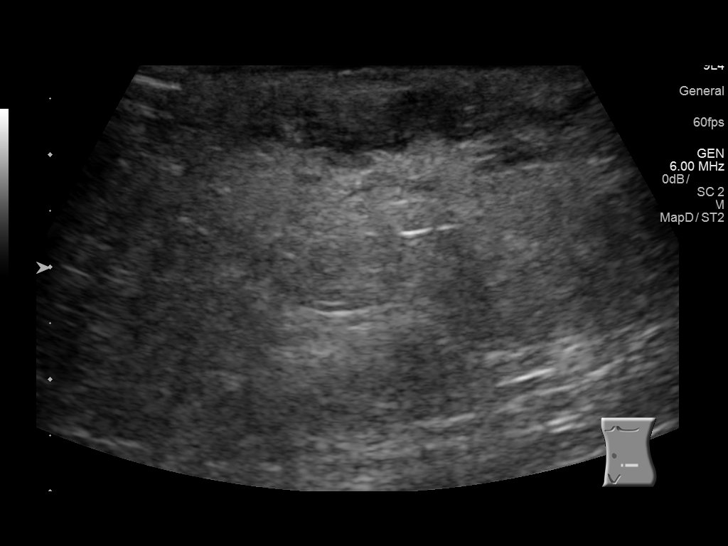
[im 17/28]
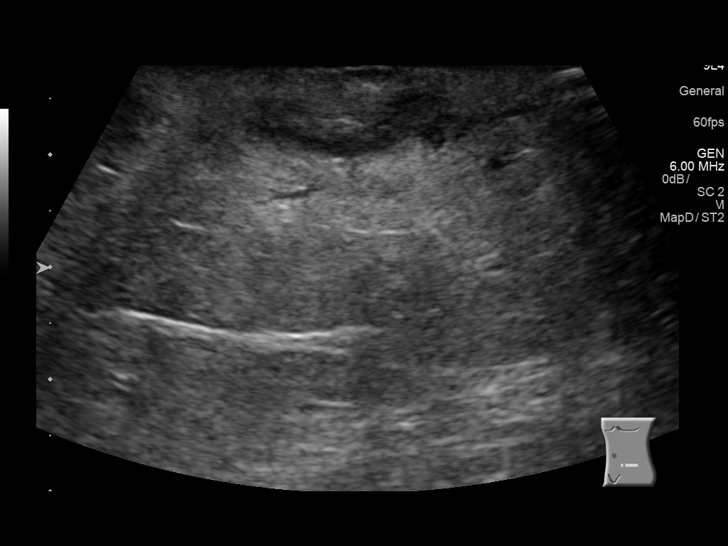
[im 19/28]
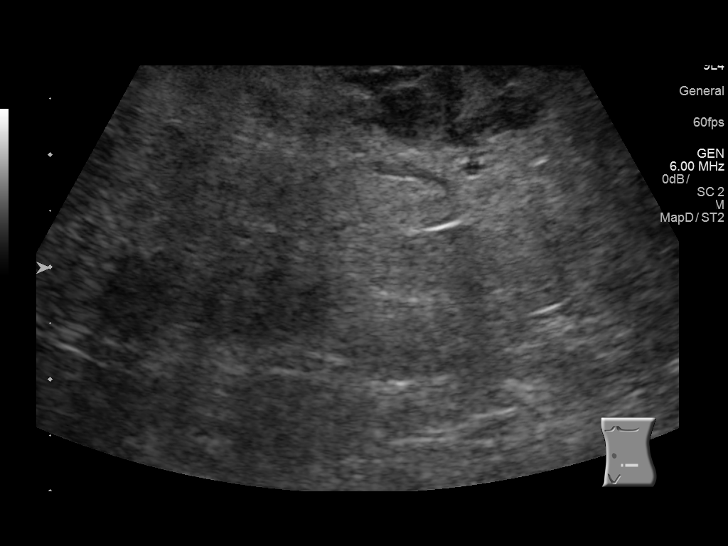
[im 21/28]
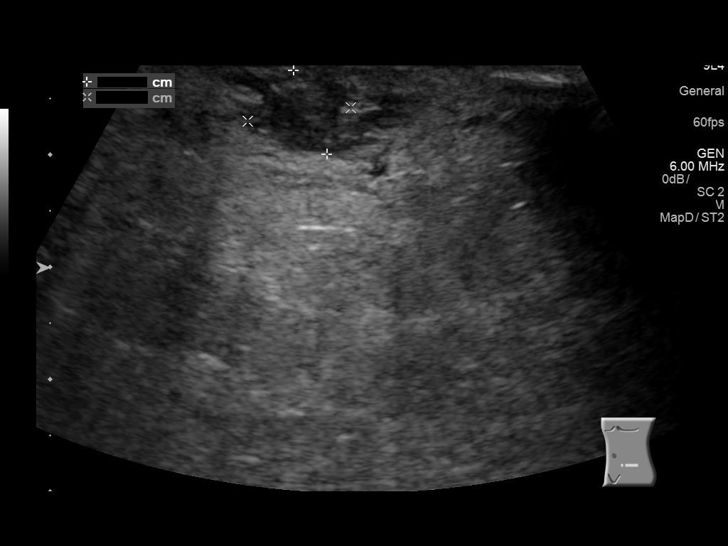
[im 23/28]
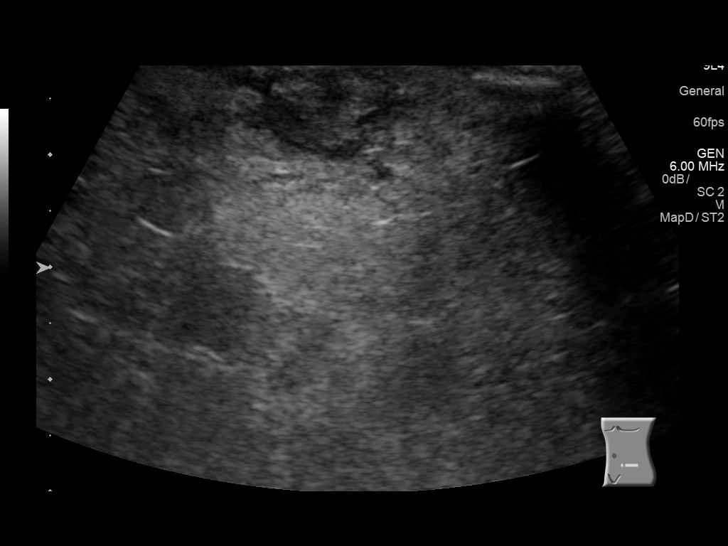
[im 25/28]
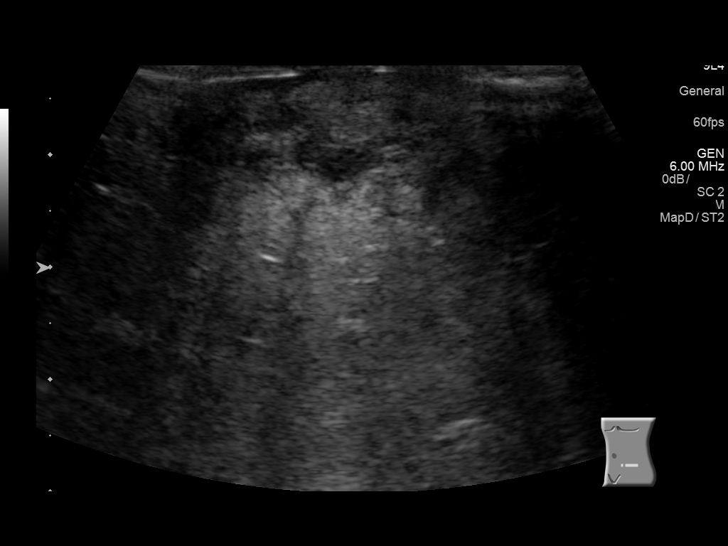
[im 28/28]
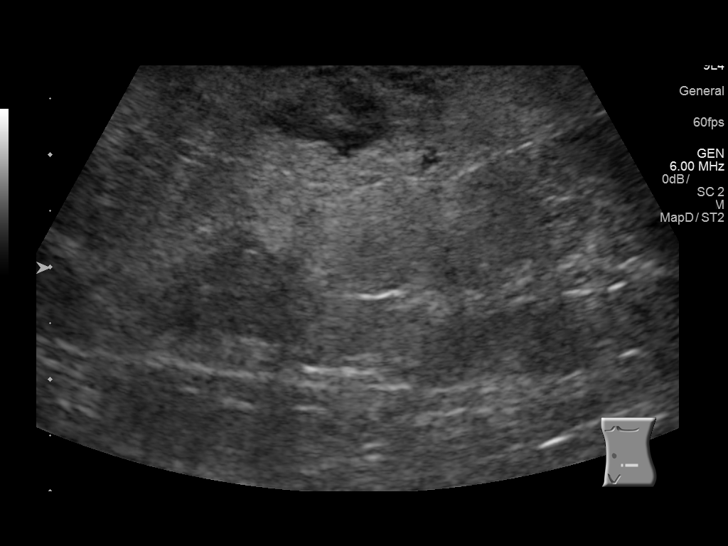

[14 of 25 positions shown; findings below may reference images not displayed]

FINDINGS: 0.8 x 0.9 x 1.2 cm tiny complex fluid collection under open wound.
This could represent a a small superficial abscess. Small hematoma
could present this fashion. No large fluid collections noted.
IMPRESSION: 1.2 cm tiny complex fluid collection noted under the open wound.
This could represent a small superficial abscess or small hematoma.
No large fluid collections are noted.

## 2018-04-04 MED FILL — METOPROLOL TARTRATE 100 MG: 100 | 30 days supply | Qty: 60 | Fill #0

## 2018-04-04 MED FILL — OLMESARTAN-HCTZ 20-12.5 MG: 20-12.5 | 30 days supply | Qty: 30 | Fill #0

## 2018-05-10 MED FILL — OLMESARTAN-HCTZ 20-12.5 MG: 20-12.5 | 30 days supply | Qty: 30 | Fill #1

## 2018-06-07 MED FILL — METOPROLOL TARTRATE 100 MG: 100 | 30 days supply | Qty: 60 | Fill #1

## 2018-06-11 MED FILL — OLMESARTAN-HCTZ 20-12.5 MG: 20-12.5 | 30 days supply | Qty: 30 | Fill #0

## 2018-07-31 MED FILL — METOPROLOL TARTRATE 100 MG: 100 | 30 days supply | Qty: 60 | Fill #0

## 2018-07-31 MED FILL — OLMESARTAN MEDOXOMIL 20 MG: 20 | 30 days supply | Qty: 30 | Fill #1

## 2018-07-31 MED FILL — OLMESARTAN-HCTZ 20-12.5 MG: 20-12.5 | 30 days supply | Qty: 30 | Fill #0

## 2018-09-13 MED FILL — METOPROLOL TARTRATE 100 MG: 100 | 30 days supply | Qty: 60 | Fill #0

## 2018-09-13 MED FILL — OLMESARTAN-HCTZ 20-12.5 MG: 20-12.5 | 30 days supply | Qty: 30 | Fill #0

## 2018-10-14 MED FILL — OLMESARTAN-HCTZ 20-12.5 MG: 20-12.5 | 30 days supply | Qty: 30 | Fill #1

## 2018-11-15 MED FILL — OLMESARTAN-HCTZ 20-12.5 MG: 20-12.5 | 30 days supply | Qty: 30 | Fill #2

## 2018-11-15 MED FILL — METOPROLOL TARTRATE 100 MG: 100 | 30 days supply | Qty: 60 | Fill #1 | Status: TO

## 2018-12-16 MED FILL — OLMESARTAN-HCTZ 20-12.5 MG: 20-12.5 | 30 days supply | Qty: 30 | Fill #3 | Status: TO

## 2019-01-11 MED FILL — METOPROLOL TARTRATE 100 MG: 100 | 30 days supply | Qty: 60 | Fill #0

## 2019-01-11 MED FILL — OLMESARTAN-HCTZ 20-12.5 MG: 20-12.5 | 30 days supply | Qty: 30 | Fill #0

## 2019-02-04 MED FILL — OLMESARTAN-HCTZ 20-12.5 MG: 20-12.5 | 30 days supply | Qty: 30 | Fill #1

## 2019-02-04 MED FILL — METOPROLOL TARTRATE 100 MG: 100 | 30 days supply | Qty: 60 | Fill #1

## 2019-03-06 MED FILL — METOPROLOL TARTRATE 100 MG: 100 | 30 days supply | Qty: 60 | Fill #2

## 2019-03-17 MED FILL — OLMESARTAN-HCTZ 20-12.5 MG: 20-12.5 | 15 days supply | Qty: 15 | Fill #0

## 2019-03-27 MED FILL — OLMESARTAN-HCTZ 40-12.5 MG: 40-12.5 | 30 days supply | Qty: 30 | Fill #0

## 2019-03-31 MED FILL — ONETOUCH DELICA PLUS LANCET: 31 days supply | Qty: 100 | Fill #0

## 2019-03-31 MED FILL — ONE TOUCH ULTRA TEST STRIPS: 25 days supply | Qty: 50 | Fill #0

## 2019-03-31 MED FILL — JANUMET XR 50-500 MG TABLET: 50-500 | 30 days supply | Qty: 30 | Fill #0

## 2019-04-02 MED FILL — ONETOUCH ULTRA 2 W/DEVICE K: W/DEVICE | 25 days supply | Qty: 1 | Fill #0

## 2019-04-05 MED FILL — METOPROLOL TARTRATE 100 MG: 100 | 30 days supply | Qty: 60 | Fill #3

## 2019-04-28 MED FILL — OLMESARTAN-HCTZ 40-12.5 MG: 40-12.5 | 30 days supply | Qty: 30 | Fill #1

## 2019-04-29 MED FILL — JENTADUETO 2.5 MG-500 MG TA: 2.5-500 | 30 days supply | Qty: 60 | Fill #0

## 2019-05-05 MED FILL — METOPROLOL TARTRATE 100 MG: 100 | 30 days supply | Qty: 60 | Fill #0

## 2019-05-30 MED FILL — OLMESARTAN-HCTZ 40-12.5 MG: 40-12.5 | 30 days supply | Qty: 30 | Fill #2

## 2019-05-30 MED FILL — METOPROLOL TARTRATE 100 MG: 100 | 30 days supply | Qty: 60 | Fill #0

## 2019-05-30 MED FILL — JENTADUETO 2.5 MG-500 MG TA: 2.5-500 | 30 days supply | Qty: 60 | Fill #1

## 2019-07-02 MED FILL — OLMESARTAN-HCTZ 40-12.5 MG: 40-12.5 | 30 days supply | Qty: 30 | Fill #3

## 2019-07-10 MED FILL — JENTADUETO 2.5 MG-500 MG TA: 2.5-500 | 30 days supply | Qty: 60 | Fill #2

## 2019-07-30 MED FILL — OLMESARTAN-HCTZ 40-12.5 MG: 40-12.5 | 30 days supply | Qty: 30 | Fill #4

## 2019-08-11 MED FILL — JENTADUETO 2.5 MG-500 MG TA: 2.5-500 | 30 days supply | Qty: 60 | Fill #0

## 2019-08-11 MED FILL — METOPROLOL TARTRATE 100 MG: 100 | 30 days supply | Qty: 60 | Fill #1

## 2019-09-01 MED FILL — OLMESARTAN-HCTZ 40-12.5 MG: 40-12.5 | 30 days supply | Qty: 30 | Fill #5

## 2019-09-18 MED FILL — METOPROLOL TARTRATE 100 MG: 100 | 30 days supply | Qty: 60 | Fill #2

## 2019-09-18 MED FILL — JENTADUETO 2.5 MG-500 MG TA: 2.5-500 | 30 days supply | Qty: 60 | Fill #1

## 2019-09-29 MED FILL — OLMESARTAN-HCTZ 40-12.5 MG: 40-12.5 | 30 days supply | Qty: 30 | Fill #0

## 2019-10-22 MED FILL — METOPROLOL TARTRATE 100 MG: 100 | 30 days supply | Qty: 60 | Fill #3

## 2019-10-22 MED FILL — JENTADUETO 2.5 MG-500 MG TA: 2.5-500 | 30 days supply | Qty: 60 | Fill #2

## 2019-10-31 MED FILL — OLMESARTAN-HCTZ 40-12.5 MG: 40-12.5 | 30 days supply | Qty: 30 | Fill #1

## 2019-12-01 MED FILL — OLMESARTAN-HCTZ 40-12.5 MG: 40-12.5 | 30 days supply | Qty: 30 | Fill #2

## 2019-12-01 MED FILL — JENTADUETO 2.5 MG-500 MG TA: 2.5-500 | 30 days supply | Qty: 60 | Fill #0

## 2019-12-01 MED FILL — METOPROLOL TARTRATE 100 MG: 100 | 30 days supply | Qty: 60 | Fill #4

## 2019-12-03 MED FILL — AMLODIPINE BESYLATE 5 MG TA: 5 | 30 days supply | Qty: 30 | Fill #0

## 2019-12-31 MED FILL — OLMESARTAN-HCTZ 40-12.5 MG: 40-12.5 | 30 days supply | Qty: 30 | Fill #0

## 2019-12-31 MED FILL — AMLODIPINE BESYLATE 5 MG TA: 5 | 30 days supply | Qty: 30 | Fill #0

## 2020-01-06 MED FILL — JENTADUETO 2.5 MG-500 MG TA: 2.5-500 | 30 days supply | Qty: 60 | Fill #0

## 2020-01-06 MED FILL — METOPROLOL TARTRATE 100 MG: 100 | 30 days supply | Qty: 60 | Fill #5

## 2020-01-27 MED FILL — SULFAMETHOXAZOLE-TMP DS TAB: 800-160 | 10 days supply | Qty: 20 | Fill #0

## 2020-01-29 MED FILL — OLMESARTAN-HCTZ 40-12.5 MG: 40-12.5 | 30 days supply | Qty: 30 | Fill #1

## 2020-01-29 MED FILL — AMLODIPINE BESYLATE 5 MG TA: 5 | 30 days supply | Qty: 30 | Fill #1

## 2020-01-30 MED FILL — CLINDAMYCIN HCL 300 MG CAPS: 300 | 10 days supply | Qty: 30 | Fill #0

## 2020-02-06 MED FILL — METOPROLOL TARTRATE 100 MG: 100 | 30 days supply | Qty: 60 | Fill #0

## 2020-02-06 MED FILL — JENTADUETO 2.5 MG-500 MG TA: 2.5-500 | 30 days supply | Qty: 60 | Fill #1

## 2020-03-01 MED FILL — OLMESARTAN-HCTZ 40-12.5 MG: 40-12.5 | 30 days supply | Qty: 30 | Fill #2

## 2020-03-01 MED FILL — AMLODIPINE BESYLATE 5 MG TA: 5 | 30 days supply | Qty: 30 | Fill #2

## 2020-03-11 MED FILL — METOPROLOL TARTRATE 100 MG: 100 | 30 days supply | Qty: 60 | Fill #1

## 2020-03-11 MED FILL — JENTADUETO 2.5 MG-500 MG TA: 2.5-500 | 30 days supply | Qty: 60 | Fill #2

## 2020-04-02 ENCOUNTER — Other Ambulatory Visit (HOSPITAL_COMMUNITY): Payer: Self-pay | Admitting: Physician Assistant

## 2020-04-02 MED FILL — AMLODIPINE BESYLATE 5 MG TA: 5 | 30 days supply | Qty: 30 | Fill #0

## 2020-04-02 MED FILL — OLMESARTAN-HCTZ 40-12.5 MG: 40-12.5 | 30 days supply | Qty: 30 | Fill #0

## 2020-04-29 MED FILL — AMLODIPINE BESYLATE 5 MG TA: 5 | 30 days supply | Qty: 30 | Fill #1

## 2020-04-29 MED FILL — JENTADUETO 2.5 MG-500 MG TA: 2.5-500 | 30 days supply | Qty: 60 | Fill #3

## 2020-04-29 MED FILL — METOPROLOL TARTRATE 100 MG: 100 | 30 days supply | Qty: 60 | Fill #2

## 2020-04-29 MED FILL — OLMESARTAN-HCTZ 40-12.5 MG: 40-12.5 | 30 days supply | Qty: 30 | Fill #1

## 2020-05-14 MED FILL — OZEMPIC 0.25 OR 0.5 MG/DOSE: 2 | 31 days supply | Qty: 2 | Fill #0

## 2020-06-01 MED FILL — AMLODIPINE BESYLATE 5 MG TA: 5 | 30 days supply | Qty: 30 | Fill #2

## 2020-06-01 MED FILL — METOPROLOL TARTRATE 100 MG: 100 | 30 days supply | Qty: 60 | Fill #3

## 2020-06-01 MED FILL — OLMESARTAN-HCTZ 40-12.5 MG: 40-12.5 | 30 days supply | Qty: 30 | Fill #2

## 2020-06-02 MED FILL — CLINDAMYCIN HCL 300 MG CAPS: 300 | 7 days supply | Qty: 21 | Fill #0

## 2020-06-25 MED FILL — OZEMPIC 0.25 OR 0.5 MG/DOSE: 2 | 28 days supply | Qty: 2 | Fill #0

## 2020-06-30 MED FILL — AMLODIPINE BESYLATE 5 MG TA: 5 | 30 days supply | Qty: 30 | Fill #3

## 2020-06-30 MED FILL — OLMESARTAN-HCTZ 40-12.5 MG: 40-12.5 | 30 days supply | Qty: 30 | Fill #3

## 2020-07-01 ENCOUNTER — Other Ambulatory Visit (HOSPITAL_COMMUNITY): Payer: Self-pay | Admitting: Physician Assistant

## 2020-07-01 MED FILL — JENTADUETO 2.5 MG-500 MG TA: 2.5-500 | 30 days supply | Qty: 60 | Fill #0

## 2020-07-01 MED FILL — METOPROLOL TARTRATE 100 MG: 100 | 31 days supply | Qty: 62 | Fill #0

## 2020-07-29 MED FILL — AMLODIPINE BESYLATE 5 MG TA: 5 | 30 days supply | Qty: 30 | Fill #4

## 2020-07-29 MED FILL — OLMESARTAN-HCTZ 40-12.5 MG: 40-12.5 | 30 days supply | Qty: 30 | Fill #4

## 2020-08-06 ENCOUNTER — Other Ambulatory Visit (HOSPITAL_COMMUNITY): Payer: Self-pay | Admitting: Geriatric Medicine

## 2020-08-06 MED FILL — OZEMPIC 0.25 OR 0.5 MG/DOSE: 2 | 28 days supply | Qty: 2 | Fill #0

## 2020-08-17 MED FILL — JENTADUETO 2.5 MG-500 MG TA: 2.5-500 | 30 days supply | Qty: 60 | Fill #1

## 2020-08-30 MED FILL — AMLODIPINE BESYLATE 5 MG TA: 5 | 30 days supply | Qty: 30 | Fill #5

## 2020-08-30 MED FILL — OLMESARTAN-HCTZ 40-12.5 MG: 40-12.5 | 30 days supply | Qty: 30 | Fill #5

## 2020-09-13 MED FILL — METOPROLOL TARTRATE 100 MG: 100 | 31 days supply | Qty: 62 | Fill #1

## 2020-09-13 MED FILL — OZEMPIC 0.25 OR 0.5 MG/DOSE: 2 | 28 days supply | Qty: 2 | Fill #1

## 2020-09-29 ENCOUNTER — Other Ambulatory Visit (HOSPITAL_COMMUNITY): Payer: Self-pay | Admitting: Physician Assistant

## 2020-09-29 MED FILL — AMLODIPINE BESYLATE 5 MG TA: 5 | 30 days supply | Qty: 30 | Fill #0

## 2020-09-29 MED FILL — OLMESARTAN-HCTZ 40-12.5 MG: 40-12.5 | 30 days supply | Qty: 30 | Fill #0

## 2020-09-29 MED FILL — JENTADUETO 2.5 MG-500 MG TA: 2.5-500 | 30 days supply | Qty: 60 | Fill #2

## 2020-10-20 MED FILL — METOPROLOL TARTRATE 100 MG: 100 | 31 days supply | Qty: 62 | Fill #2

## 2020-10-27 MED FILL — OLMESARTAN-HCTZ 40-12.5 MG: 40-12.5 | 30 days supply | Qty: 30 | Fill #1

## 2020-10-27 MED FILL — AMLODIPINE BESYLATE 5 MG TA: 5 | 30 days supply | Qty: 30 | Fill #1

## 2020-11-15 ENCOUNTER — Other Ambulatory Visit (HOSPITAL_COMMUNITY): Payer: Self-pay | Admitting: Physician Assistant

## 2020-11-15 MED FILL — JENTADUETO 2.5 MG-500 MG TA: 2.5-500 | 30 days supply | Qty: 60 | Fill #0

## 2020-12-01 MED FILL — OLMESARTAN-HCTZ 40-12.5 MG: 40-12.5 | 30 days supply | Qty: 30 | Fill #2

## 2020-12-01 MED FILL — AMLODIPINE BESYLATE 5 MG TA: 5 | 30 days supply | Qty: 30 | Fill #2

## 2020-12-09 MED FILL — METOPROLOL TARTRATE 100 MG: 100 | 31 days supply | Qty: 62 | Fill #3

## 2020-12-28 ENCOUNTER — Other Ambulatory Visit (HOSPITAL_COMMUNITY): Payer: Self-pay | Admitting: Physician Assistant

## 2020-12-28 MED FILL — OLMESARTAN-HCTZ 40-12.5 MG: 40-12.5 | 15 days supply | Qty: 15 | Fill #0

## 2020-12-28 MED FILL — AMLODIPINE BESYLATE 5 MG TA: 5 | 15 days supply | Qty: 15 | Fill #0

## 2020-12-28 MED FILL — JENTADUETO 2.5 MG-500 MG TA: 2.5-500 | 15 days supply | Qty: 30 | Fill #0

## 2021-01-12 ENCOUNTER — Other Ambulatory Visit (HOSPITAL_COMMUNITY): Payer: Self-pay

## 2021-01-12 MED ORDER — OLMESARTAN MEDOXOMIL-HCTZ 40-12.5 MG PO TABS
1.0000 | ORAL_TABLET | Freq: Every day | ORAL | 1 refills | Status: DC
  Filled 2021-01-12: qty 30, 30d supply, fill #0
  Filled 2021-02-18: qty 30, 30d supply, fill #1
  Filled 2021-03-25: qty 30, 30d supply, fill #2
  Filled 2021-04-29: qty 30, 30d supply, fill #3
  Filled 2021-06-02: qty 30, 30d supply, fill #4
  Filled 2021-07-08: qty 30, 30d supply, fill #5

## 2021-01-12 MED ORDER — AMLODIPINE BESYLATE 5 MG PO TABS
5.0000 mg | ORAL_TABLET | Freq: Two times a day (BID) | ORAL | 1 refills | Status: AC
  Filled 2021-01-12: qty 60, 30d supply, fill #0
  Filled 2021-03-01: qty 60, 30d supply, fill #1
  Filled 2021-04-12: qty 60, 30d supply, fill #2
  Filled 2021-05-19: qty 60, 30d supply, fill #3
  Filled 2021-06-27: qty 60, 30d supply, fill #4
  Filled 2021-08-08: qty 60, 30d supply, fill #5

## 2021-01-12 MED ORDER — METOPROLOL TARTRATE 100 MG PO TABS
100.0000 mg | ORAL_TABLET | Freq: Two times a day (BID) | ORAL | 1 refills | Status: AC
  Filled 2021-01-12: qty 60, 30d supply, fill #0
  Filled 2021-03-01: qty 60, 30d supply, fill #1
  Filled 2021-04-12: qty 60, 30d supply, fill #2
  Filled 2021-05-19: qty 60, 30d supply, fill #3
  Filled 2021-06-27: qty 60, 30d supply, fill #4
  Filled 2021-08-08: qty 60, 30d supply, fill #5

## 2021-01-14 ENCOUNTER — Other Ambulatory Visit (HOSPITAL_COMMUNITY): Payer: Self-pay

## 2021-01-14 MED ORDER — JENTADUETO 2.5-500 MG PO TABS
1.0000 | ORAL_TABLET | Freq: Two times a day (BID) | ORAL | 0 refills | Status: DC
  Filled 2021-01-14: qty 60, 30d supply, fill #0

## 2021-01-14 MED ORDER — ROSUVASTATIN CALCIUM 10 MG PO TABS
10.0000 mg | ORAL_TABLET | Freq: Every day | ORAL | 1 refills | Status: AC
  Filled 2021-01-14: qty 30, 30d supply, fill #0
  Filled 2021-03-25: qty 30, 30d supply, fill #1

## 2021-01-17 ENCOUNTER — Other Ambulatory Visit (HOSPITAL_COMMUNITY): Payer: Self-pay

## 2021-02-18 ENCOUNTER — Other Ambulatory Visit (HOSPITAL_COMMUNITY): Payer: Self-pay

## 2021-03-01 ENCOUNTER — Other Ambulatory Visit (HOSPITAL_COMMUNITY): Payer: Self-pay

## 2021-03-01 MED ORDER — JENTADUETO 2.5-500 MG PO TABS
1.0000 | ORAL_TABLET | Freq: Two times a day (BID) | ORAL | 0 refills | Status: DC
  Filled 2021-03-01: qty 60, 30d supply, fill #0
  Filled 2021-04-12: qty 60, 30d supply, fill #1
  Filled 2021-05-19: qty 60, 30d supply, fill #2

## 2021-03-25 ENCOUNTER — Other Ambulatory Visit (HOSPITAL_COMMUNITY): Payer: Self-pay

## 2021-03-29 ENCOUNTER — Other Ambulatory Visit (HOSPITAL_COMMUNITY): Payer: Self-pay

## 2021-04-12 ENCOUNTER — Other Ambulatory Visit (HOSPITAL_COMMUNITY): Payer: Self-pay

## 2021-04-29 ENCOUNTER — Other Ambulatory Visit (HOSPITAL_COMMUNITY): Payer: Self-pay

## 2021-05-11 ENCOUNTER — Other Ambulatory Visit (HOSPITAL_COMMUNITY): Payer: Self-pay

## 2021-05-11 MED ORDER — SPIRONOLACTONE 25 MG PO TABS
25.0000 mg | ORAL_TABLET | Freq: Every day | ORAL | 1 refills | Status: DC
  Filled 2021-05-11: qty 30, 30d supply, fill #0
  Filled 2021-06-15: qty 30, 30d supply, fill #1

## 2021-05-19 ENCOUNTER — Other Ambulatory Visit (HOSPITAL_COMMUNITY): Payer: Self-pay

## 2021-06-02 ENCOUNTER — Other Ambulatory Visit (HOSPITAL_COMMUNITY): Payer: Self-pay

## 2021-06-15 ENCOUNTER — Other Ambulatory Visit (HOSPITAL_COMMUNITY): Payer: Self-pay

## 2021-06-27 ENCOUNTER — Other Ambulatory Visit (HOSPITAL_COMMUNITY): Payer: Self-pay

## 2021-06-27 MED ORDER — JENTADUETO 2.5-500 MG PO TABS
1.0000 | ORAL_TABLET | Freq: Two times a day (BID) | ORAL | 0 refills | Status: DC
  Filled 2021-06-27: qty 60, 30d supply, fill #0
  Filled 2021-08-08: qty 60, 30d supply, fill #1
  Filled 2021-10-05: qty 60, 30d supply, fill #2

## 2021-07-08 ENCOUNTER — Other Ambulatory Visit (HOSPITAL_COMMUNITY): Payer: Self-pay

## 2021-07-14 ENCOUNTER — Other Ambulatory Visit (HOSPITAL_COMMUNITY): Payer: Self-pay

## 2021-07-14 MED ORDER — METOPROLOL TARTRATE 100 MG PO TABS
100.0000 mg | ORAL_TABLET | Freq: Two times a day (BID) | ORAL | 1 refills | Status: DC
  Filled 2021-07-14 – 2021-10-06 (×2): qty 60, 30d supply, fill #0
  Filled 2021-11-17: qty 60, 30d supply, fill #1
  Filled 2021-12-23: qty 60, 30d supply, fill #2
  Filled 2022-01-29: qty 60, 30d supply, fill #3
  Filled 2022-03-09: qty 60, 30d supply, fill #4
  Filled 2022-04-13: qty 60, 30d supply, fill #5

## 2021-07-14 MED ORDER — OLMESARTAN MEDOXOMIL-HCTZ 40-12.5 MG PO TABS
1.0000 | ORAL_TABLET | Freq: Two times a day (BID) | ORAL | 1 refills | Status: DC
  Filled 2021-07-14 – 2021-08-03 (×2): qty 60, 30d supply, fill #0
  Filled 2021-09-21: qty 60, 30d supply, fill #1
  Filled 2021-11-17: qty 60, 30d supply, fill #2
  Filled 2021-12-23: qty 60, 30d supply, fill #3
  Filled 2022-01-29: qty 60, 30d supply, fill #4
  Filled 2022-03-09: qty 60, 30d supply, fill #5

## 2021-07-14 MED ORDER — OZEMPIC (0.25 OR 0.5 MG/DOSE) 2 MG/1.5ML ~~LOC~~ SOPN
0.5000 mg | PEN_INJECTOR | SUBCUTANEOUS | 1 refills | Status: AC
  Filled 2021-07-14 – 2021-08-05 (×2): qty 1.5, 28d supply, fill #0
  Filled 2021-10-11: qty 1.5, 28d supply, fill #1

## 2021-07-14 MED ORDER — AMLODIPINE BESYLATE 5 MG PO TABS
5.0000 mg | ORAL_TABLET | Freq: Two times a day (BID) | ORAL | 1 refills | Status: DC
  Filled 2021-07-14 – 2021-10-06 (×2): qty 60, 30d supply, fill #0
  Filled 2021-11-17: qty 60, 30d supply, fill #1
  Filled 2021-12-23: qty 60, 30d supply, fill #2
  Filled 2022-01-29: qty 60, 30d supply, fill #3
  Filled 2022-03-09: qty 60, 30d supply, fill #4
  Filled 2022-04-13: qty 60, 30d supply, fill #5

## 2021-07-26 ENCOUNTER — Other Ambulatory Visit (HOSPITAL_COMMUNITY): Payer: Self-pay

## 2021-08-03 ENCOUNTER — Other Ambulatory Visit (HOSPITAL_COMMUNITY): Payer: Self-pay

## 2021-08-03 MED ORDER — SPIRONOLACTONE 25 MG PO TABS
25.0000 mg | ORAL_TABLET | Freq: Every day | ORAL | 1 refills | Status: DC
  Filled 2021-08-03: qty 30, 30d supply, fill #0
  Filled 2021-08-25: qty 30, 30d supply, fill #1

## 2021-08-05 ENCOUNTER — Other Ambulatory Visit (HOSPITAL_COMMUNITY): Payer: Self-pay

## 2021-08-08 ENCOUNTER — Other Ambulatory Visit (HOSPITAL_COMMUNITY): Payer: Self-pay

## 2021-08-09 ENCOUNTER — Other Ambulatory Visit (HOSPITAL_COMMUNITY): Payer: Self-pay

## 2021-08-10 ENCOUNTER — Other Ambulatory Visit (HOSPITAL_COMMUNITY): Payer: Self-pay

## 2021-08-25 ENCOUNTER — Other Ambulatory Visit (HOSPITAL_COMMUNITY): Payer: Self-pay

## 2021-08-26 ENCOUNTER — Other Ambulatory Visit (HOSPITAL_COMMUNITY): Payer: Self-pay

## 2021-09-21 ENCOUNTER — Other Ambulatory Visit (HOSPITAL_COMMUNITY): Payer: Self-pay

## 2021-10-05 ENCOUNTER — Other Ambulatory Visit (HOSPITAL_COMMUNITY): Payer: Self-pay

## 2021-10-05 ENCOUNTER — Other Ambulatory Visit: Payer: Self-pay

## 2021-10-05 MED ORDER — SPIRONOLACTONE 25 MG PO TABS
25.0000 mg | ORAL_TABLET | Freq: Every day | ORAL | 0 refills | Status: DC
  Filled 2021-10-05: qty 30, 30d supply, fill #0

## 2021-10-06 ENCOUNTER — Other Ambulatory Visit (HOSPITAL_COMMUNITY): Payer: Self-pay

## 2021-10-11 ENCOUNTER — Other Ambulatory Visit (HOSPITAL_COMMUNITY): Payer: Self-pay

## 2021-10-18 ENCOUNTER — Other Ambulatory Visit (HOSPITAL_COMMUNITY): Payer: Self-pay

## 2021-10-18 MED ORDER — SPIRONOLACTONE 25 MG PO TABS
25.0000 mg | ORAL_TABLET | Freq: Every day | ORAL | 1 refills | Status: AC
  Filled 2021-10-18 – 2021-11-17 (×2): qty 30, 30d supply, fill #0
  Filled 2021-12-23: qty 30, 30d supply, fill #1
  Filled 2022-01-29: qty 30, 30d supply, fill #2

## 2021-10-19 ENCOUNTER — Other Ambulatory Visit (HOSPITAL_COMMUNITY): Payer: Self-pay

## 2021-10-19 MED ORDER — MOUNJARO 2.5 MG/0.5ML ~~LOC~~ SOAJ: SUBCUTANEOUS | 1 refills | Status: DC

## 2021-10-19 MED ORDER — FENOFIBRIC ACID 105 MG PO TABS
105.0000 mg | ORAL_TABLET | Freq: Every day | ORAL | 0 refills | Status: AC
  Filled 2021-10-19: qty 30, 30d supply, fill #0

## 2021-11-12 ENCOUNTER — Other Ambulatory Visit (HOSPITAL_COMMUNITY): Payer: Self-pay

## 2021-11-14 ENCOUNTER — Other Ambulatory Visit (HOSPITAL_COMMUNITY): Payer: Self-pay

## 2021-11-15 ENCOUNTER — Other Ambulatory Visit (HOSPITAL_COMMUNITY): Payer: Self-pay

## 2021-11-15 MED ORDER — MOUNJARO 2.5 MG/0.5ML ~~LOC~~ SOAJ
SUBCUTANEOUS | 1 refills | Status: DC
  Filled 2021-11-15: qty 2, 28d supply, fill #0
  Filled 2021-12-23: qty 2, 28d supply, fill #1

## 2021-11-15 MED ORDER — MOUNJARO 2.5 MG/0.5ML ~~LOC~~ SOAJ
2.5000 mg | SUBCUTANEOUS | 0 refills | Status: DC
  Filled 2021-11-15: qty 4, 56d supply, fill #0

## 2021-11-17 ENCOUNTER — Other Ambulatory Visit (HOSPITAL_COMMUNITY): Payer: Self-pay

## 2021-11-17 MED ORDER — JENTADUETO 2.5-500 MG PO TABS
1.0000 | ORAL_TABLET | Freq: Two times a day (BID) | ORAL | 0 refills | Status: DC
  Filled 2021-11-17: qty 60, 30d supply, fill #0
  Filled 2021-12-23: qty 60, 30d supply, fill #1
  Filled 2022-01-29: qty 60, 30d supply, fill #2

## 2021-12-23 ENCOUNTER — Other Ambulatory Visit (HOSPITAL_COMMUNITY): Payer: Self-pay

## 2022-01-29 ENCOUNTER — Other Ambulatory Visit (HOSPITAL_COMMUNITY): Payer: Self-pay

## 2022-01-30 ENCOUNTER — Other Ambulatory Visit (HOSPITAL_COMMUNITY): Payer: Self-pay

## 2022-01-30 MED ORDER — MOUNJARO 2.5 MG/0.5ML ~~LOC~~ SOAJ
2.5000 mg | SUBCUTANEOUS | 1 refills | Status: DC
  Filled 2022-01-30: qty 2, 28d supply, fill #0

## 2022-03-09 ENCOUNTER — Other Ambulatory Visit (HOSPITAL_COMMUNITY): Payer: Self-pay

## 2022-03-09 MED ORDER — JENTADUETO 2.5-500 MG PO TABS
1.0000 | ORAL_TABLET | Freq: Two times a day (BID) | ORAL | 0 refills | Status: DC
  Filled 2022-03-09: qty 60, 30d supply, fill #0
  Filled 2022-04-13: qty 60, 30d supply, fill #1
  Filled 2022-05-16: qty 60, 30d supply, fill #2

## 2022-04-13 ENCOUNTER — Other Ambulatory Visit (HOSPITAL_COMMUNITY): Payer: Self-pay

## 2022-04-14 ENCOUNTER — Other Ambulatory Visit (HOSPITAL_COMMUNITY): Payer: Self-pay

## 2022-04-14 MED ORDER — OLMESARTAN MEDOXOMIL-HCTZ 40-12.5 MG PO TABS
1.0000 | ORAL_TABLET | Freq: Two times a day (BID) | ORAL | 0 refills | Status: DC
Start: 2022-04-14 — End: 2022-07-26
  Filled 2022-04-14: qty 60, 30d supply, fill #0
  Filled 2022-05-16: qty 60, 30d supply, fill #1
  Filled 2022-06-23: qty 60, 30d supply, fill #2

## 2022-04-17 ENCOUNTER — Other Ambulatory Visit (HOSPITAL_COMMUNITY): Payer: Self-pay

## 2022-04-17 MED ORDER — AMLODIPINE BESYLATE 5 MG PO TABS
5.0000 mg | ORAL_TABLET | Freq: Two times a day (BID) | ORAL | 1 refills | Status: AC
  Filled 2022-04-17: qty 180, 90d supply, fill #0
  Filled 2022-05-16: qty 60, 30d supply, fill #0

## 2022-04-17 MED ORDER — METOPROLOL TARTRATE 100 MG PO TABS
100.0000 mg | ORAL_TABLET | Freq: Two times a day (BID) | ORAL | 1 refills | Status: AC
  Filled 2022-04-17: qty 180, 90d supply, fill #0
  Filled 2022-05-16: qty 60, 30d supply, fill #0
  Filled 2022-06-23: qty 60, 30d supply, fill #1
  Filled 2022-07-25: qty 60, 30d supply, fill #2
  Filled 2022-08-28: qty 60, 30d supply, fill #3
  Filled 2022-10-04 (×2): qty 60, 30d supply, fill #4
  Filled 2022-11-06: qty 60, 30d supply, fill #5

## 2022-04-18 ENCOUNTER — Other Ambulatory Visit (HOSPITAL_COMMUNITY): Payer: Self-pay

## 2022-04-18 MED ORDER — MOUNJARO 5 MG/0.5ML ~~LOC~~ SOAJ
5.0000 mg | SUBCUTANEOUS | 0 refills | Status: DC
  Filled 2022-04-18: qty 2, 28d supply, fill #0

## 2022-05-16 ENCOUNTER — Other Ambulatory Visit (HOSPITAL_COMMUNITY): Payer: Self-pay

## 2022-05-16 MED ORDER — MOUNJARO 10 MG/0.5ML ~~LOC~~ SOAJ
10.0000 mg | SUBCUTANEOUS | 0 refills | Status: AC
  Filled 2022-05-16: qty 2, 28d supply, fill #0

## 2022-06-15 ENCOUNTER — Other Ambulatory Visit (HOSPITAL_COMMUNITY): Payer: Self-pay

## 2022-06-15 MED ORDER — MOUNJARO 12.5 MG/0.5ML ~~LOC~~ SOAJ
12.5000 mg | SUBCUTANEOUS | 2 refills | Status: AC
  Filled 2022-06-15: qty 2, 28d supply, fill #0
  Filled 2022-07-13: qty 2, 28d supply, fill #1
  Filled 2022-08-18: qty 2, 28d supply, fill #2

## 2022-06-15 MED ORDER — METFORMIN HCL 500 MG PO TABS
500.0000 mg | ORAL_TABLET | Freq: Two times a day (BID) | ORAL | 1 refills | Status: DC
  Filled 2022-06-15: qty 60, 30d supply, fill #0
  Filled 2022-07-18: qty 60, 30d supply, fill #1
  Filled 2022-08-28: qty 60, 30d supply, fill #2
  Filled 2022-10-04 (×2): qty 60, 30d supply, fill #3
  Filled 2022-11-06: qty 60, 30d supply, fill #4
  Filled 2022-12-08: qty 60, 30d supply, fill #5

## 2022-06-23 ENCOUNTER — Other Ambulatory Visit (HOSPITAL_COMMUNITY): Payer: Self-pay

## 2022-07-13 ENCOUNTER — Other Ambulatory Visit (HOSPITAL_COMMUNITY): Payer: Self-pay

## 2022-07-17 ENCOUNTER — Other Ambulatory Visit (HOSPITAL_COMMUNITY): Payer: Self-pay

## 2022-07-17 MED ORDER — BUDESONIDE-FORMOTEROL FUMARATE 80-4.5 MCG/ACT IN AERO
2.0000 | INHALATION_SPRAY | Freq: Two times a day (BID) | RESPIRATORY_TRACT | 0 refills | Status: AC
  Filled 2022-07-17: qty 10.2, 30d supply, fill #0

## 2022-07-17 MED ORDER — ALBUTEROL SULFATE HFA 108 (90 BASE) MCG/ACT IN AERS
2.0000 | INHALATION_SPRAY | RESPIRATORY_TRACT | 0 refills | Status: AC | PRN
  Filled 2022-07-17: qty 6.7, 17d supply, fill #0

## 2022-07-18 ENCOUNTER — Other Ambulatory Visit (HOSPITAL_COMMUNITY): Payer: Self-pay

## 2022-07-25 ENCOUNTER — Other Ambulatory Visit (HOSPITAL_COMMUNITY): Payer: Self-pay

## 2022-07-26 ENCOUNTER — Other Ambulatory Visit (HOSPITAL_COMMUNITY): Payer: Self-pay

## 2022-07-26 MED ORDER — OLMESARTAN MEDOXOMIL-HCTZ 40-12.5 MG PO TABS
1.0000 | ORAL_TABLET | Freq: Two times a day (BID) | ORAL | 0 refills | Status: DC
  Filled 2022-07-26: qty 60, 30d supply, fill #0
  Filled 2022-08-30: qty 60, 30d supply, fill #1
  Filled 2022-10-04 (×2): qty 60, 30d supply, fill #2

## 2022-08-18 ENCOUNTER — Other Ambulatory Visit (HOSPITAL_COMMUNITY): Payer: Self-pay

## 2022-08-21 ENCOUNTER — Other Ambulatory Visit (HOSPITAL_COMMUNITY): Payer: Self-pay

## 2022-08-28 ENCOUNTER — Other Ambulatory Visit (HOSPITAL_COMMUNITY): Payer: Self-pay

## 2022-08-30 ENCOUNTER — Other Ambulatory Visit (HOSPITAL_COMMUNITY): Payer: Self-pay

## 2022-09-28 ENCOUNTER — Other Ambulatory Visit (HOSPITAL_COMMUNITY): Payer: Self-pay

## 2022-09-29 ENCOUNTER — Other Ambulatory Visit: Payer: Self-pay

## 2022-09-29 ENCOUNTER — Other Ambulatory Visit (HOSPITAL_COMMUNITY): Payer: Self-pay

## 2022-09-29 MED ORDER — MOUNJARO 15 MG/0.5ML ~~LOC~~ SOAJ
0.5000 mg | SUBCUTANEOUS | 0 refills | Status: DC
  Filled 2022-09-29: qty 2, 28d supply, fill #0

## 2022-10-04 ENCOUNTER — Other Ambulatory Visit (HOSPITAL_COMMUNITY): Payer: Self-pay

## 2022-10-16 ENCOUNTER — Other Ambulatory Visit (HOSPITAL_COMMUNITY): Payer: Self-pay

## 2022-10-16 MED ORDER — METOPROLOL TARTRATE 100 MG PO TABS
100.0000 mg | ORAL_TABLET | Freq: Two times a day (BID) | ORAL | 1 refills | Status: AC
  Filled 2022-10-16 – 2022-12-08 (×2): qty 60, 30d supply, fill #0
  Filled 2023-01-08: qty 60, 30d supply, fill #1
  Filled 2023-02-09: qty 60, 30d supply, fill #2
  Filled 2023-03-13: qty 60, 30d supply, fill #3
  Filled 2023-04-15: qty 60, 30d supply, fill #4
  Filled 2023-05-16: qty 60, 30d supply, fill #5

## 2022-10-16 MED ORDER — AMLODIPINE BESYLATE 5 MG PO TABS
5.0000 mg | ORAL_TABLET | Freq: Two times a day (BID) | ORAL | 1 refills | Status: AC
  Filled 2022-10-16: qty 60, 30d supply, fill #0

## 2022-10-16 MED ORDER — OLMESARTAN MEDOXOMIL-HCTZ 40-12.5 MG PO TABS
1.0000 | ORAL_TABLET | Freq: Two times a day (BID) | ORAL | 1 refills | Status: DC
  Filled 2022-10-16 – 2022-11-06 (×2): qty 60, 30d supply, fill #0
  Filled 2022-12-08: qty 60, 30d supply, fill #1
  Filled 2023-01-08: qty 60, 30d supply, fill #2
  Filled 2023-02-09: qty 60, 30d supply, fill #3
  Filled 2023-03-13: qty 60, 30d supply, fill #4
  Filled 2023-04-15: qty 60, 30d supply, fill #5

## 2022-10-30 ENCOUNTER — Other Ambulatory Visit (HOSPITAL_COMMUNITY): Payer: Self-pay

## 2022-11-06 ENCOUNTER — Other Ambulatory Visit (HOSPITAL_COMMUNITY): Payer: Self-pay

## 2022-11-06 ENCOUNTER — Other Ambulatory Visit: Payer: Self-pay

## 2022-11-06 MED ORDER — MOUNJARO 15 MG/0.5ML ~~LOC~~ SOAJ
15.0000 mg | SUBCUTANEOUS | 3 refills | Status: DC
  Filled 2022-11-06: qty 2, 28d supply, fill #0
  Filled 2022-12-05: qty 2, 28d supply, fill #1
  Filled 2023-01-22: qty 2, 28d supply, fill #2
  Filled 2023-03-07: qty 2, 28d supply, fill #3

## 2022-11-07 ENCOUNTER — Other Ambulatory Visit: Payer: Self-pay

## 2022-11-07 ENCOUNTER — Other Ambulatory Visit (HOSPITAL_COMMUNITY): Payer: Self-pay

## 2022-12-08 ENCOUNTER — Other Ambulatory Visit (HOSPITAL_COMMUNITY): Payer: Self-pay

## 2022-12-12 ENCOUNTER — Other Ambulatory Visit (HOSPITAL_COMMUNITY): Payer: Self-pay

## 2023-01-08 ENCOUNTER — Other Ambulatory Visit (HOSPITAL_COMMUNITY): Payer: Self-pay

## 2023-01-09 ENCOUNTER — Other Ambulatory Visit (HOSPITAL_COMMUNITY): Payer: Self-pay

## 2023-01-09 MED ORDER — METFORMIN HCL 500 MG PO TABS
500.0000 mg | ORAL_TABLET | Freq: Two times a day (BID) | ORAL | 1 refills | Status: DC
  Filled 2023-01-09: qty 60, 30d supply, fill #0
  Filled 2023-02-09: qty 60, 30d supply, fill #1
  Filled 2023-03-13: qty 60, 30d supply, fill #2
  Filled 2023-04-15: qty 60, 30d supply, fill #3
  Filled 2023-05-16: qty 60, 30d supply, fill #4
  Filled 2023-06-20: qty 60, 30d supply, fill #5

## 2023-01-22 ENCOUNTER — Other Ambulatory Visit (HOSPITAL_COMMUNITY): Payer: Self-pay

## 2023-03-09 ENCOUNTER — Other Ambulatory Visit (HOSPITAL_COMMUNITY): Payer: Self-pay

## 2023-03-16 ENCOUNTER — Other Ambulatory Visit (HOSPITAL_COMMUNITY): Payer: Self-pay

## 2023-04-17 ENCOUNTER — Other Ambulatory Visit (HOSPITAL_COMMUNITY): Payer: Self-pay

## 2023-04-17 MED ORDER — OLMESARTAN MEDOXOMIL-HCTZ 40-12.5 MG PO TABS
1.0000 | ORAL_TABLET | Freq: Two times a day (BID) | ORAL | 1 refills | Status: DC
  Filled 2023-04-17: qty 180, 90d supply, fill #0
  Filled 2023-05-16: qty 60, 30d supply, fill #0
  Filled 2023-06-20: qty 60, 30d supply, fill #1
  Filled 2023-07-20 (×2): qty 60, 30d supply, fill #2
  Filled 2023-08-23: qty 60, 30d supply, fill #3
  Filled 2023-09-26: qty 60, 30d supply, fill #4
  Filled 2023-10-25: qty 60, 30d supply, fill #5

## 2023-04-17 MED ORDER — METOPROLOL TARTRATE 100 MG PO TABS
100.0000 mg | ORAL_TABLET | Freq: Two times a day (BID) | ORAL | 1 refills | Status: AC
  Filled 2023-04-17: qty 180, 90d supply, fill #0
  Filled 2023-06-21: qty 60, 30d supply, fill #0
  Filled 2023-07-20 (×2): qty 60, 30d supply, fill #1
  Filled 2023-08-23: qty 60, 30d supply, fill #2
  Filled 2023-09-26: qty 60, 30d supply, fill #3
  Filled 2023-10-25: qty 60, 30d supply, fill #4
  Filled 2023-11-25: qty 60, 30d supply, fill #5

## 2023-04-27 ENCOUNTER — Other Ambulatory Visit (HOSPITAL_BASED_OUTPATIENT_CLINIC_OR_DEPARTMENT_OTHER): Payer: Self-pay

## 2023-04-30 ENCOUNTER — Other Ambulatory Visit (HOSPITAL_COMMUNITY): Payer: Self-pay

## 2023-04-30 MED ORDER — TIRZEPATIDE 15 MG/0.5ML ~~LOC~~ SOAJ
15.0000 mg | SUBCUTANEOUS | 3 refills | Status: DC
  Filled 2023-04-30: qty 2, 28d supply, fill #0
  Filled 2023-06-20: qty 2, 28d supply, fill #1
  Filled 2023-08-23: qty 2, 28d supply, fill #2
  Filled 2023-10-22: qty 2, 28d supply, fill #3

## 2023-05-03 ENCOUNTER — Other Ambulatory Visit (HOSPITAL_COMMUNITY): Payer: Self-pay

## 2023-05-10 ENCOUNTER — Other Ambulatory Visit (HOSPITAL_COMMUNITY): Payer: Self-pay

## 2023-05-16 ENCOUNTER — Other Ambulatory Visit (HOSPITAL_COMMUNITY): Payer: Self-pay

## 2023-05-16 ENCOUNTER — Other Ambulatory Visit: Payer: Self-pay

## 2023-06-20 ENCOUNTER — Other Ambulatory Visit (HOSPITAL_COMMUNITY): Payer: Self-pay

## 2023-06-21 ENCOUNTER — Other Ambulatory Visit (HOSPITAL_COMMUNITY): Payer: Self-pay

## 2023-07-20 ENCOUNTER — Other Ambulatory Visit: Payer: Self-pay

## 2023-07-20 ENCOUNTER — Other Ambulatory Visit (HOSPITAL_COMMUNITY): Payer: Self-pay

## 2023-07-20 MED ORDER — METFORMIN HCL 500 MG PO TABS
500.0000 mg | ORAL_TABLET | Freq: Two times a day (BID) | ORAL | 1 refills | Status: DC
  Filled 2023-07-20: qty 60, 30d supply, fill #0
  Filled 2023-08-23: qty 60, 30d supply, fill #1
  Filled 2023-09-26: qty 60, 30d supply, fill #2
  Filled 2023-10-25: qty 60, 30d supply, fill #3
  Filled 2023-11-25: qty 60, 30d supply, fill #4
  Filled 2023-12-27: qty 60, 30d supply, fill #5

## 2023-07-23 ENCOUNTER — Other Ambulatory Visit: Payer: Self-pay

## 2023-10-18 ENCOUNTER — Other Ambulatory Visit (HOSPITAL_COMMUNITY): Payer: Self-pay

## 2023-10-18 MED ORDER — ESCITALOPRAM OXALATE 10 MG PO TABS
10.0000 mg | ORAL_TABLET | Freq: Every day | ORAL | 0 refills | Status: DC
  Filled 2023-10-18: qty 30, 30d supply, fill #0

## 2023-10-18 MED ORDER — AMLODIPINE BESYLATE 2.5 MG PO TABS
2.5000 mg | ORAL_TABLET | Freq: Two times a day (BID) | ORAL | 0 refills | Status: DC
  Filled 2023-10-18: qty 60, 30d supply, fill #0

## 2023-11-09 ENCOUNTER — Other Ambulatory Visit (HOSPITAL_COMMUNITY): Payer: Self-pay

## 2023-11-09 ENCOUNTER — Other Ambulatory Visit: Payer: Self-pay

## 2023-11-09 MED ORDER — OLMESARTAN MEDOXOMIL-HCTZ 40-12.5 MG PO TABS
1.0000 | ORAL_TABLET | Freq: Two times a day (BID) | ORAL | 1 refills | Status: DC
  Filled 2023-11-25: qty 60, 30d supply, fill #0
  Filled 2023-12-27: qty 60, 30d supply, fill #1
  Filled 2024-01-28: qty 60, 30d supply, fill #2
  Filled 2024-03-05: qty 60, 30d supply, fill #3
  Filled 2024-04-09: qty 60, 30d supply, fill #4
  Filled 2024-05-16: qty 60, 30d supply, fill #5

## 2023-11-09 MED ORDER — ALBUTEROL SULFATE HFA 108 (90 BASE) MCG/ACT IN AERS
2.0000 | INHALATION_SPRAY | RESPIRATORY_TRACT | 1 refills | Status: AC | PRN
  Filled 2023-11-09: qty 6.7, 17d supply, fill #0

## 2023-11-09 MED ORDER — AMLODIPINE BESYLATE 2.5 MG PO TABS
2.5000 mg | ORAL_TABLET | Freq: Two times a day (BID) | ORAL | 1 refills | Status: AC
  Filled 2023-11-19: qty 60, 30d supply, fill #0
  Filled 2023-12-18: qty 60, 30d supply, fill #1
  Filled 2024-01-28: qty 60, 30d supply, fill #2
  Filled 2024-03-05: qty 60, 30d supply, fill #3
  Filled 2024-04-09: qty 60, 30d supply, fill #4

## 2023-11-09 MED ORDER — METOPROLOL TARTRATE 100 MG PO TABS
100.0000 mg | ORAL_TABLET | Freq: Two times a day (BID) | ORAL | 1 refills | Status: AC
Start: 2023-11-09 — End: ?
  Filled 2024-01-02: qty 60, 30d supply, fill #0
  Filled 2024-01-28: qty 60, 30d supply, fill #1
  Filled 2024-03-05: qty 60, 30d supply, fill #2
  Filled 2024-04-09: qty 60, 30d supply, fill #3
  Filled 2024-05-16: qty 60, 30d supply, fill #4
  Filled 2024-06-23: qty 60, 30d supply, fill #5

## 2023-11-09 MED ORDER — BUDESONIDE-FORMOTEROL FUMARATE 80-4.5 MCG/ACT IN AERO
2.0000 | INHALATION_SPRAY | Freq: Two times a day (BID) | RESPIRATORY_TRACT | 1 refills | Status: AC
  Filled 2023-11-09: qty 10.2, 30d supply, fill #0

## 2023-11-09 MED ORDER — ESCITALOPRAM OXALATE 10 MG PO TABS
10.0000 mg | ORAL_TABLET | Freq: Every day | ORAL | 1 refills | Status: AC
  Filled 2023-11-19: qty 30, 30d supply, fill #0
  Filled 2023-12-18: qty 30, 30d supply, fill #1
  Filled 2024-01-17: qty 30, 30d supply, fill #2
  Filled 2024-02-19: qty 30, 30d supply, fill #3
  Filled 2024-03-20: qty 30, 30d supply, fill #4
  Filled 2024-04-30: qty 30, 30d supply, fill #5

## 2023-11-14 ENCOUNTER — Other Ambulatory Visit (HOSPITAL_COMMUNITY): Payer: Self-pay

## 2023-11-19 ENCOUNTER — Other Ambulatory Visit (HOSPITAL_COMMUNITY): Payer: Self-pay

## 2023-11-20 ENCOUNTER — Other Ambulatory Visit (HOSPITAL_COMMUNITY): Payer: Self-pay

## 2023-11-26 ENCOUNTER — Other Ambulatory Visit (HOSPITAL_COMMUNITY): Payer: Self-pay

## 2023-12-27 ENCOUNTER — Other Ambulatory Visit (HOSPITAL_COMMUNITY): Payer: Self-pay

## 2023-12-28 ENCOUNTER — Other Ambulatory Visit (HOSPITAL_COMMUNITY): Payer: Self-pay

## 2023-12-31 ENCOUNTER — Other Ambulatory Visit (HOSPITAL_COMMUNITY): Payer: Self-pay

## 2024-01-01 ENCOUNTER — Other Ambulatory Visit (HOSPITAL_COMMUNITY): Payer: Self-pay

## 2024-01-02 ENCOUNTER — Other Ambulatory Visit (HOSPITAL_COMMUNITY): Payer: Self-pay

## 2024-01-17 ENCOUNTER — Other Ambulatory Visit (HOSPITAL_COMMUNITY): Payer: Self-pay

## 2024-01-17 MED ORDER — TIRZEPATIDE 15 MG/0.5ML ~~LOC~~ SOAJ
15.0000 mg | SUBCUTANEOUS | 3 refills | Status: AC
  Filled 2024-01-17: qty 2, 28d supply, fill #0
  Filled 2024-03-20: qty 2, 28d supply, fill #1
  Filled 2024-06-17: qty 2, 28d supply, fill #2
  Filled 2024-10-13 – 2024-10-17 (×2): qty 2, 28d supply, fill #3

## 2024-01-28 ENCOUNTER — Other Ambulatory Visit (HOSPITAL_COMMUNITY): Payer: Self-pay

## 2024-01-28 MED ORDER — METFORMIN HCL 500 MG PO TABS
500.0000 mg | ORAL_TABLET | Freq: Two times a day (BID) | ORAL | 1 refills | Status: DC
Start: 2024-01-28 — End: 2024-09-05
  Filled 2024-01-28: qty 60, 30d supply, fill #0
  Filled 2024-03-05: qty 60, 30d supply, fill #1
  Filled 2024-04-09: qty 60, 30d supply, fill #2
  Filled 2024-05-16: qty 60, 30d supply, fill #3
  Filled 2024-06-17: qty 60, 30d supply, fill #4
  Filled 2024-07-29 – 2024-07-31 (×2): qty 60, 30d supply, fill #5

## 2024-04-23 ENCOUNTER — Other Ambulatory Visit (HOSPITAL_COMMUNITY): Payer: Self-pay

## 2024-04-23 ENCOUNTER — Other Ambulatory Visit: Payer: Self-pay

## 2024-04-23 MED ORDER — METOPROLOL TARTRATE 100 MG PO TABS
100.0000 mg | ORAL_TABLET | Freq: Two times a day (BID) | ORAL | 1 refills | Status: AC
  Filled 2024-08-01: qty 60, 30d supply, fill #0
  Filled 2024-09-05: qty 60, 30d supply, fill #1
  Filled 2024-09-10: qty 60, 30d supply, fill #0
  Filled 2024-10-13: qty 60, 30d supply, fill #1

## 2024-04-23 MED ORDER — ESCITALOPRAM OXALATE 10 MG PO TABS
10.0000 mg | ORAL_TABLET | Freq: Every day | ORAL | 1 refills | Status: AC
  Filled 2024-04-23: qty 30, 30d supply, fill #0
  Filled 2024-06-17: qty 30, 30d supply, fill #1
  Filled 2024-07-29 – 2024-07-31 (×2): qty 30, 30d supply, fill #2
  Filled 2024-09-05: qty 30, 30d supply, fill #3
  Filled 2024-09-10: qty 30, 30d supply, fill #0
  Filled 2024-10-13: qty 30, 30d supply, fill #1

## 2024-04-23 MED ORDER — OLMESARTAN MEDOXOMIL-HCTZ 40-12.5 MG PO TABS
1.0000 | ORAL_TABLET | Freq: Every day | ORAL | 1 refills | Status: AC
  Filled 2024-10-17: qty 30, 30d supply, fill #0

## 2024-04-23 MED ORDER — AMLODIPINE BESYLATE 2.5 MG PO TABS: 2.5000 mg | ORAL_TABLET | Freq: Two times a day (BID) | ORAL | 1 refills | Status: AC

## 2024-04-24 ENCOUNTER — Other Ambulatory Visit (HOSPITAL_COMMUNITY): Payer: Self-pay

## 2024-04-24 MED ORDER — ATORVASTATIN CALCIUM 10 MG PO TABS
10.0000 mg | ORAL_TABLET | Freq: Every day | ORAL | 0 refills | Status: DC
  Filled 2024-04-24: qty 30, 30d supply, fill #0
  Filled 2024-06-17: qty 30, 30d supply, fill #1
  Filled 2024-07-29 – 2024-07-31 (×2): qty 30, 30d supply, fill #2

## 2024-04-30 ENCOUNTER — Other Ambulatory Visit (HOSPITAL_COMMUNITY): Payer: Self-pay

## 2024-06-23 ENCOUNTER — Other Ambulatory Visit (HOSPITAL_COMMUNITY): Payer: Self-pay

## 2024-06-23 ENCOUNTER — Other Ambulatory Visit: Payer: Self-pay

## 2024-06-24 ENCOUNTER — Other Ambulatory Visit: Payer: Self-pay

## 2024-06-24 ENCOUNTER — Other Ambulatory Visit (HOSPITAL_COMMUNITY): Payer: Self-pay

## 2024-06-24 MED ORDER — OLMESARTAN MEDOXOMIL-HCTZ 40-12.5 MG PO TABS
1.0000 | ORAL_TABLET | Freq: Two times a day (BID) | ORAL | 0 refills | Status: AC
  Filled 2024-06-24: qty 60, 30d supply, fill #0
  Filled 2024-07-29 – 2024-07-31 (×2): qty 60, 30d supply, fill #1
  Filled 2024-09-05: qty 60, 30d supply, fill #2
  Filled 2024-09-10: qty 60, 30d supply, fill #0

## 2024-07-29 ENCOUNTER — Other Ambulatory Visit (HOSPITAL_COMMUNITY): Payer: Self-pay

## 2024-07-29 ENCOUNTER — Other Ambulatory Visit: Payer: Self-pay

## 2024-07-31 ENCOUNTER — Other Ambulatory Visit (HOSPITAL_COMMUNITY): Payer: Self-pay

## 2024-07-31 ENCOUNTER — Other Ambulatory Visit (HOSPITAL_BASED_OUTPATIENT_CLINIC_OR_DEPARTMENT_OTHER): Payer: Self-pay

## 2024-08-01 ENCOUNTER — Other Ambulatory Visit (HOSPITAL_BASED_OUTPATIENT_CLINIC_OR_DEPARTMENT_OTHER): Payer: Self-pay

## 2024-09-05 ENCOUNTER — Other Ambulatory Visit (HOSPITAL_COMMUNITY): Payer: Self-pay

## 2024-09-05 MED ORDER — METFORMIN HCL 500 MG PO TABS
500.0000 mg | ORAL_TABLET | Freq: Two times a day (BID) | ORAL | 1 refills | Status: AC
  Filled 2024-09-05 – 2024-09-10 (×2): qty 60, 30d supply, fill #0
  Filled 2024-10-13: qty 60, 30d supply, fill #1

## 2024-09-05 MED ORDER — ATORVASTATIN CALCIUM 10 MG PO TABS
10.0000 mg | ORAL_TABLET | Freq: Every day | ORAL | 0 refills | Status: AC
  Filled 2024-09-05 – 2024-09-10 (×2): qty 30, 30d supply, fill #0
  Filled 2024-10-13: qty 30, 30d supply, fill #1

## 2024-09-10 ENCOUNTER — Other Ambulatory Visit: Payer: Self-pay

## 2024-09-10 ENCOUNTER — Other Ambulatory Visit (HOSPITAL_BASED_OUTPATIENT_CLINIC_OR_DEPARTMENT_OTHER): Payer: Self-pay

## 2024-09-10 ENCOUNTER — Other Ambulatory Visit (HOSPITAL_COMMUNITY): Payer: Self-pay

## 2024-10-13 ENCOUNTER — Other Ambulatory Visit: Payer: Self-pay

## 2024-10-13 ENCOUNTER — Other Ambulatory Visit (HOSPITAL_BASED_OUTPATIENT_CLINIC_OR_DEPARTMENT_OTHER): Payer: Self-pay

## 2024-10-17 ENCOUNTER — Other Ambulatory Visit (HOSPITAL_BASED_OUTPATIENT_CLINIC_OR_DEPARTMENT_OTHER): Payer: Self-pay

## 2024-10-17 ENCOUNTER — Other Ambulatory Visit (HOSPITAL_COMMUNITY): Payer: Self-pay

## 2024-10-18 ENCOUNTER — Other Ambulatory Visit (HOSPITAL_BASED_OUTPATIENT_CLINIC_OR_DEPARTMENT_OTHER): Payer: Self-pay

## 2024-10-20 ENCOUNTER — Other Ambulatory Visit (HOSPITAL_BASED_OUTPATIENT_CLINIC_OR_DEPARTMENT_OTHER): Payer: Self-pay

## 2024-11-10 ENCOUNTER — Other Ambulatory Visit (HOSPITAL_COMMUNITY): Payer: Self-pay

## 2024-11-10 MED ORDER — OLMESARTAN MEDOXOMIL-HCTZ 40-12.5 MG PO TABS
1.0000 | ORAL_TABLET | Freq: Two times a day (BID) | ORAL | 0 refills | Status: AC
  Filled 2024-11-10: qty 60, 30d supply, fill #0

## 2024-11-12 ENCOUNTER — Other Ambulatory Visit (HOSPITAL_BASED_OUTPATIENT_CLINIC_OR_DEPARTMENT_OTHER): Payer: Self-pay
